# Patient Record
Sex: Female | Born: 1995
Health system: Southern US, Community
[De-identification: ages and names within clinical notes are randomized; demographics above are authoritative.]

## PROBLEM LIST (undated history)

## (undated) ENCOUNTER — Emergency Department (HOSPITAL_COMMUNITY): Admission: EM | Payer: Medicaid Other | Source: Home / Self Care

## (undated) DIAGNOSIS — B019 Varicella without complication: Secondary | ICD-10-CM

## (undated) DIAGNOSIS — R519 Headache, unspecified: Secondary | ICD-10-CM

## (undated) DIAGNOSIS — R51 Headache: Secondary | ICD-10-CM

## (undated) DIAGNOSIS — J45909 Unspecified asthma, uncomplicated: Secondary | ICD-10-CM

## (undated) HISTORY — DX: Headache, unspecified: R51.9

## (undated) HISTORY — DX: Headache: R51

## (undated) HISTORY — PX: WISDOM TOOTH EXTRACTION: SHX21

## (undated) HISTORY — DX: Varicella without complication: B01.9

---
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
## 2013-01-01 ENCOUNTER — Encounter: Payer: Self-pay | Admitting: Psychiatry

## 2013-07-08 ENCOUNTER — Emergency Department (HOSPITAL_COMMUNITY)
Admission: EM | Admit: 2013-07-08 | Discharge: 2013-07-08 | Disposition: A | Discharge status: Home / Self Care | Payer: Medicaid Other | Attending: Emergency Medicine | Admitting: Emergency Medicine

## 2013-07-08 ENCOUNTER — Encounter (HOSPITAL_COMMUNITY): Payer: Self-pay | Admitting: Emergency Medicine

## 2013-07-08 DIAGNOSIS — J45909 Unspecified asthma, uncomplicated: Secondary | ICD-10-CM | POA: Insufficient documentation

## 2013-07-08 DIAGNOSIS — Z3202 Encounter for pregnancy test, result negative: Secondary | ICD-10-CM | POA: Insufficient documentation

## 2013-07-08 DIAGNOSIS — R112 Nausea with vomiting, unspecified: Secondary | ICD-10-CM | POA: Insufficient documentation

## 2013-07-08 DIAGNOSIS — Z880 Allergy status to penicillin: Secondary | ICD-10-CM | POA: Insufficient documentation

## 2013-07-08 DIAGNOSIS — Z79899 Other long term (current) drug therapy: Secondary | ICD-10-CM | POA: Insufficient documentation

## 2013-07-08 HISTORY — DX: Unspecified asthma, uncomplicated: J45.909

## 2013-07-08 LAB — URINALYSIS, ROUTINE W REFLEX MICROSCOPIC
Bilirubin Urine: NEGATIVE
Hgb urine dipstick: NEGATIVE
Ketones, ur: NEGATIVE mg/dL
Specific Gravity, Urine: 1.013 (ref 1.005–1.030)

## 2013-07-08 LAB — BASIC METABOLIC PANEL
Calcium: 10 mg/dL (ref 8.4–10.5)
Chloride: 105 mEq/L (ref 96–112)
Glucose, Bld: 103 mg/dL — ABNORMAL HIGH (ref 70–99)
Potassium: 3.8 mEq/L (ref 3.5–5.1)

## 2013-07-08 LAB — CBC WITH DIFFERENTIAL/PLATELET
Basophils Relative: 1 % (ref 0–1)
HCT: 37.5 % (ref 36.0–46.0)
Lymphocytes Relative: 20 % (ref 12–46)
MCHC: 34.7 g/dL (ref 30.0–36.0)
MCV: 81.5 fL (ref 78.0–100.0)
Monocytes Relative: 4 % (ref 3–12)
Neutro Abs: 7.4 10*3/uL (ref 1.7–7.7)
Neutrophils Relative %: 76 % (ref 43–77)
RDW: 13.2 % (ref 11.5–15.5)

## 2013-07-08 LAB — URINE MICROSCOPIC-ADD ON

## 2013-07-08 LAB — HEPATIC FUNCTION PANEL: Total Bilirubin: 0.2 mg/dL — ABNORMAL LOW (ref 0.3–1.2)

## 2013-07-08 MED ORDER — PROMETHAZINE HCL 25 MG PO TABS: 25.0000 mg | ORAL_TABLET | Freq: Four times a day (QID) | ORAL | Status: DC | PRN

## 2013-07-08 MED ORDER — SODIUM CHLORIDE 0.9 % IV BOLUS (SEPSIS)
1000.0000 mL | Freq: Once | INTRAVENOUS | Status: AC
  Administered 2013-07-08: 1000 mL via INTRAVENOUS

## 2013-07-08 MED ORDER — PROCHLORPERAZINE EDISYLATE 5 MG/ML IJ SOLN
10.0000 mg | Freq: Once | INTRAMUSCULAR | Status: DC
  Filled 2013-07-08: qty 2

## 2013-07-08 NOTE — ED Notes (Signed)
Visual Acuity check: No corrective eye wear Left eye 20/10 Right eye 20/13 Together 20/10

## 2013-07-08 NOTE — ED Notes (Signed)
Patient ambulated to restroom and tolerated well.  

## 2013-07-08 NOTE — ED Notes (Signed)
Patient states nausea x 3 days.  Vomited one time this morning.   Patient states she has just been feeling poorly the last few days.   Patient states she went to her doctors office yesterday.   Patient states not pregnant - they did a test at her doctors office.  Patient states that the difference from yesterday to today was blurred vision when she vomited.

## 2013-07-08 NOTE — ED Provider Notes (Signed)
Medical screening examination/treatment/procedure(s) were performed by non-physician practitioner and as supervising physician I was immediately available for consultation/collaboration.  Jolana Runkles, MD 07/08/13 1414 

## 2013-07-08 NOTE — ED Provider Notes (Signed)
CSN: [id number]     Arrival date & time 07/08/13  1029 History     First MD Initiated Contact with Patient 07/08/13 1044     Chief Complaint  Patient presents with  . Nausea  . Emesis   (Consider location/radiation/quality/duration/timing/severity/associated sxs/prior Treatment) HPI  Cynthia Klein is a 17 y.o. female with past medical history significant for asthma complaining of nausea onset 3 days ago, states nausea is worse in the a.m. She had single episode of nonbloody, nonbilious emesis this a.m. Patient also reports feeling shaky, weak endorses a diffuse, 5/10 headache, took Excedrin Migraine last night with good relief.. Patient states she has blurred vision today. She denies fever, cervicalgia, cough, rhinorrhea, abdominal pain.  Last menstrual period 2 weeks  ago  Past Medical History  Diagnosis Date  . Asthma    History reviewed. No pertinent past surgical history. No family history on file. History  Substance Use Topics  . Smoking status: Never Smoker   . Smokeless tobacco: Not on file  . Alcohol Use: No   OB History   Grav Para Term Preterm Abortions TAB SAB Ect Mult Living                 Review of Systems 10 systems reviewed and found to be negative, except as noted in the HPI  Allergies  Penicillins  Home Medications   Current Outpatient Rx  Name  Route  Sig  Dispense  Refill  . Aspirin-Acetaminophen-Caffeine (EXCEDRIN MIGRAINE PO)   Oral   Take 2 tablets by mouth daily as needed (headache).         . norethindrone-ethinyl estradiol (JUNEL FE,GILDESS FE,LOESTRIN FE) 1-20 MG-MCG tablet   Oral   Take 1 tablet by mouth daily.          BP 113/64  Pulse 58  Temp(Src) 97.7 F (36.5 C) (Oral)  Resp 18  Ht 4\' 10"  (1.473 m)  Wt 92 lb (41.731 kg)  BMI 19.23 kg/m2  SpO2 100%  LMP 06/24/2013 Physical Exam  Nursing note and vitals reviewed. Constitutional: She is oriented to person, place, and time. She appears well-developed and well-nourished.  No distress.  HENT:  Head: Normocephalic and atraumatic.  Mouth/Throat: Oropharynx is clear and moist.  Left eye 20/10 Right eye 20/13 Together 20/10  Eyes: Conjunctivae and EOM are normal. Pupils are equal, round, and reactive to light.  Neck: Normal range of motion.  Cardiovascular: Normal rate, regular rhythm and intact distal pulses.   Pulmonary/Chest: Effort normal and breath sounds normal. No stridor.  Abdominal: Soft. Bowel sounds are normal.  Musculoskeletal: Normal range of motion.  Neurological: She is alert and oriented to person, place, and time.  Follows commands, Goal oriented speech, Strength is 5 out of 5x4 extremities, patient ambulates with a coordinated in nonantalgic gait. Sensation is grossly intact.  No Cut on visual fields   Psychiatric: She has a normal mood and affect.    ED Course   Procedures (including critical care time)  Labs Reviewed  URINALYSIS, ROUTINE W REFLEX MICROSCOPIC  PREGNANCY, URINE  CBC WITH DIFFERENTIAL  BASIC METABOLIC PANEL  HEPATIC FUNCTION PANEL  LIPASE, BLOOD   No results found. 1. Nausea & vomiting     MDM   Filed Vitals:   07/08/13 1200 07/08/13 1202 07/08/13 1203 07/08/13 1220  BP: 103/61 109/63 104/64 99/51  Pulse: 95 94 97 87  Temp:    98 F (36.7 C)  TempSrc:    Oral  Resp:  16  Height:      Weight:      SpO2:    100%     Cynthia Klein is a 17 y.o. female with 3 days of morning nausea, one episode of vomiting mild headache associated with blurred vision. Pt HA treated and improved while in ED.  Presentation is like pts typical HA and non concerning for [facility], ICH, Meningitis, or temporal arteritis. Pt is afebrile with no focal neuro deficits, nuchal rigidity, or change in vision. Pt is to follow up with PCP to discuss prophylactic medication. Pt verbalizes understanding and is agreeable with plan to dc.   Medications  prochlorperazine (COMPAZINE) injection 10 mg (10 mg Intravenous Not Given 07/08/13 1204)   sodium chloride 0.9 % bolus 1,000 mL (1,000 mLs Intravenous New Bag/Given 07/08/13 1159)    Pt is hemodynamically stable, appropriate for, and amenable to discharge at this time. Pt verbalized understanding and agrees with care plan. All questions answered. Outpatient follow-up and specific return precautions discussed.    New Prescriptions   PROMETHAZINE (PHENERGAN) 25 MG TABLET    Take 1 tablet (25 mg total) by mouth every 6 (six) hours as needed for nausea.    Note: Portions of this report may have been transcribed using voice recognition software. Every effort was made to ensure accuracy; however, inadvertent computerized transcription errors may be present    Wynetta Emery, PA-C 07/08/13 1358

## 2013-07-10 ENCOUNTER — Emergency Department (HOSPITAL_COMMUNITY)
Admission: EM | Admit: 2013-07-10 | Discharge: 2013-07-10 | Disposition: A | Discharge status: Home / Self Care | Payer: Medicaid Other | Attending: Emergency Medicine | Admitting: Emergency Medicine

## 2013-07-10 ENCOUNTER — Encounter (HOSPITAL_COMMUNITY): Payer: Self-pay

## 2013-07-10 ENCOUNTER — Emergency Department (HOSPITAL_COMMUNITY): Payer: Medicaid Other

## 2013-07-10 DIAGNOSIS — R110 Nausea: Secondary | ICD-10-CM | POA: Insufficient documentation

## 2013-07-10 DIAGNOSIS — J45909 Unspecified asthma, uncomplicated: Secondary | ICD-10-CM | POA: Insufficient documentation

## 2013-07-10 DIAGNOSIS — R42 Dizziness and giddiness: Secondary | ICD-10-CM | POA: Insufficient documentation

## 2013-07-10 DIAGNOSIS — Z79899 Other long term (current) drug therapy: Secondary | ICD-10-CM | POA: Insufficient documentation

## 2013-07-10 DIAGNOSIS — Z3202 Encounter for pregnancy test, result negative: Secondary | ICD-10-CM | POA: Insufficient documentation

## 2013-07-10 DIAGNOSIS — Z880 Allergy status to penicillin: Secondary | ICD-10-CM | POA: Insufficient documentation

## 2013-07-10 DIAGNOSIS — G43909 Migraine, unspecified, not intractable, without status migrainosus: Secondary | ICD-10-CM | POA: Insufficient documentation

## 2013-07-10 LAB — BASIC METABOLIC PANEL
CO2: 24 mEq/L (ref 19–32)
Glucose, Bld: 118 mg/dL — ABNORMAL HIGH (ref 70–99)
Potassium: 3.6 mEq/L (ref 3.5–5.1)
Sodium: 143 mEq/L (ref 135–145)

## 2013-07-10 LAB — CBC WITH DIFFERENTIAL/PLATELET
Lymphocytes Relative: 16 % (ref 12–46)
Lymphs Abs: 0.9 10*3/uL (ref 0.7–4.0)
Neutrophils Relative %: 82 % — ABNORMAL HIGH (ref 43–77)
Platelets: 235 10*3/uL (ref 150–400)
RBC: 4.43 MIL/uL (ref 3.87–5.11)
WBC: 5.8 10*3/uL (ref 4.0–10.5)

## 2013-07-10 LAB — URINALYSIS, ROUTINE W REFLEX MICROSCOPIC
Hgb urine dipstick: NEGATIVE
Nitrite: NEGATIVE
Specific Gravity, Urine: 1.005 (ref 1.005–1.030)
Urobilinogen, UA: 0.2 mg/dL (ref 0.0–1.0)

## 2013-07-10 MED ORDER — SODIUM CHLORIDE 0.9 % IV BOLUS (SEPSIS)
1000.0000 mL | Freq: Once | INTRAVENOUS | Status: AC
  Administered 2013-07-10: 1000 mL via INTRAVENOUS

## 2013-07-10 MED ORDER — DEXAMETHASONE SODIUM PHOSPHATE 10 MG/ML IJ SOLN
10.0000 mg | Freq: Once | INTRAMUSCULAR | Status: AC
  Administered 2013-07-10: 10 mg via INTRAVENOUS
  Filled 2013-07-10: qty 1

## 2013-07-10 MED ORDER — MECLIZINE HCL 25 MG PO TABS: 25.0000 mg | ORAL_TABLET | Freq: Four times a day (QID) | ORAL | Status: DC

## 2013-07-10 MED ORDER — LORAZEPAM 2 MG/ML IJ SOLN
0.5000 mg | Freq: Once | INTRAMUSCULAR | Status: AC
  Administered 2013-07-10: 0.5 mg via INTRAVENOUS
  Filled 2013-07-10: qty 1

## 2013-07-10 MED ORDER — SODIUM CHLORIDE 0.9 % IV SOLN
INTRAVENOUS | Status: DC
  Administered 2013-07-10: 20:00:00 via INTRAVENOUS

## 2013-07-10 MED ORDER — PROMETHAZINE HCL 25 MG/ML IJ SOLN
25.0000 mg | Freq: Once | INTRAMUSCULAR | Status: AC
  Administered 2013-07-10: 25 mg via INTRAVENOUS
  Filled 2013-07-10: qty 1

## 2013-07-10 MED ORDER — MECLIZINE HCL 25 MG PO TABS
25.0000 mg | ORAL_TABLET | Freq: Once | ORAL | Status: AC
  Administered 2013-07-10: 25 mg via ORAL
  Filled 2013-07-10: qty 1

## 2013-07-10 MED ORDER — DIPHENHYDRAMINE HCL 50 MG/ML IJ SOLN
25.0000 mg | Freq: Once | INTRAMUSCULAR | Status: AC
  Administered 2013-07-10: 25 mg via INTRAVENOUS
  Filled 2013-07-10: qty 1

## 2013-07-10 NOTE — ED Notes (Signed)
Pt c/o headache and nausea x5 days, blurred vision, vomiting, posterior neck pain, and tremors x 3 days

## 2013-07-10 NOTE — ED Notes (Signed)
Urine was on hold in the main lab.  UA form was sent down.

## 2013-07-10 NOTE — ED Provider Notes (Signed)
Medical screening examination/treatment/procedure(s) were performed by non-physician practitioner and as supervising physician I was immediately available for consultation/collaboration.   David H Yao, MD 07/10/13 2348 

## 2013-07-10 NOTE — ED Provider Notes (Signed)
CSN: [id number]     Arrival date & time 07/10/13  1527 History     First MD Initiated Contact with Patient 07/10/13 1607     Chief Complaint  Patient presents with  . Headache  . Nausea   (Consider location/radiation/quality/duration/timing/severity/associated sxs/prior Treatment) HPI  PCP: Nathanial Millman, MD 17 yo WF returns [facility] for persistent migraine and nausea x 5 days. Came into ED two days ago for same migraine and was given IV fluids and Phenergan. States migraine was never relieved and now c/o dizziness and sore neck since last night. Throbbing migraine localized to the left temple and grades pain 5/10. Has tried Excedrin Migraine to help her fall asleep. Has not filled Phenergan Rx given to her at prior ED visit.   LMP- two weeks ago. Pt has hx of migraines but feels as though they are becoming more frequent. + Family hx.  Denies any fevers, chills, emesis, cough, phonophobia, decrease in [place], numbness/tingling, weakness, diarrhea, constipation, or dysuria.     Past Medical History  Diagnosis Date  . Asthma    History reviewed. No pertinent past surgical history. History reviewed. No pertinent family history. History  Substance Use Topics  . Smoking status: Never Smoker   . Smokeless tobacco: Not on file  . Alcohol Use: No   OB History   Grav Para Term Preterm Abortions TAB SAB Ect Mult Living                 Review of Systems ROS is negative unless otherwise stated in the HPI  Allergies  Penicillins  Home Medications   Current Outpatient Rx  Name  Route  Sig  Dispense  Refill  . Aspirin-Acetaminophen-Caffeine (EXCEDRIN MIGRAINE PO)   Oral   Take 2 tablets by mouth daily as needed (headache).         . norethindrone-ethinyl estradiol (JUNEL FE,GILDESS FE,LOESTRIN FE) 1-20 MG-MCG tablet   Oral   Take 1 tablet by mouth daily.         . promethazine (PHENERGAN) 25 MG tablet   Oral   Take 1 tablet (25 mg total) by mouth every 6 (six) hours as needed  for nausea.   12 tablet   0   . meclizine (ANTIVERT) 25 MG tablet   Oral   Take 1 tablet (25 mg total) by mouth 4 (four) times daily.   28 tablet   0    BP 116/70  Pulse 94  Temp(Src) 98.2 F (36.8 C) (Oral)  Resp 22  SpO2 100%  LMP 06/24/2013 Physical Exam  Nursing note and vitals reviewed. Constitutional: She is oriented to person, place, and time. She appears well-developed and well-nourished. No distress.  HENT:  Head: Normocephalic and atraumatic.  Eyes: Pupils are equal, round, and reactive to light.  Neck: Normal range of motion. Neck supple.  Cardiovascular: Normal rate and regular rhythm.   Pulmonary/Chest: Effort normal.  Abdominal: Soft.  Neurological: She is alert and oriented to person, place, and time. She has normal strength. No cranial nerve deficit or sensory deficit. Coordination and gait normal. GCS eye subscore is 4. GCS verbal subscore is 5. GCS motor subscore is 6.  Skin: Skin is warm and dry.    ED Course   Procedures (including critical care time)  Labs Reviewed  CBC WITH DIFFERENTIAL - Abnormal; Notable for the following:    Neutrophils Relative % 82 (*)    Monocytes Relative 2 (*)    All other components within normal limits  BASIC METABOLIC PANEL - Abnormal; Notable for the following:    Glucose, Bld 118 (*)    All other components within normal limits  PREGNANCY, URINE  URINALYSIS, ROUTINE W REFLEX MICROSCOPIC   Ct Head Wo Contrast  07/10/2013   *RADIOLOGY REPORT*  Clinical Data: Headache and dizziness, nausea and vomiting  CT HEAD WITHOUT CONTRAST  Technique:  Contiguous axial images were obtained from the base of the skull through the vertex without contrast.  Comparison: 05/21/2010  Findings: Streak artifact from the patient's earrings is noted at the skull base. No acute hemorrhage, acute infarction, or mass lesion is identified.  No midline shift.  No ventriculomegaly.  No skull fracture.  No midline shift.  Orbits and paranasal sinuses  are intact.  IMPRESSION: No acute intracranial finding.   Original Report Authenticated By: Christiana Pellant, M.D.   1. Vertigo   2. Migraine     MDM  IV Saline 1000 ml, 10mg  IV Decadron 10mg , 25mg  IV Benadryl  Nursing student attempting to start IV and patient got really anxious about it.  Date: 07/10/2013  Rate: 140  Rhythm: sinus tachycardia  QRS Axis: normal  Intervals: normal  ST/T Wave abnormalities: normal  Conduction Disutrbances:none  Narrative Interpretation:   Old EKG Reviewed: unchanged    Patient is feeling much better. Headache has completely resolveed. Work-up is benign and pt and mother are comfortable taking her home. Pt will follow-up with PCP and i will give a neurology referral.  Rx: Antivert.  17 y.o.Terrence Morrone's evaluation in the Emergency Department is complete. It has been determined that no acute conditions requiring further emergency intervention are present at this time. The patient/guardian have been advised of the diagnosis and plan. We have discussed signs and symptoms that warrant return to the ED, such as changes or worsening in symptoms.  Vital signs are stable at discharge. Filed Vitals:   07/10/13 1915  BP: 116/70  Pulse: 94  Temp:   Resp: 22    Patient/guardian has voiced understanding and agreed to follow-up with the PCP or specialist.      Dorthula Matas, PA-C 07/10/13 2038

## 2013-07-10 NOTE — ED Notes (Signed)
Pt stated she had just went to the restroom and when she was able to void again she would advise staff.

## 2013-08-03 ENCOUNTER — Encounter: Payer: Self-pay | Admitting: Neurology

## 2013-08-03 ENCOUNTER — Ambulatory Visit (INDEPENDENT_AMBULATORY_CARE_PROVIDER_SITE_OTHER): Payer: Medicaid Other | Admitting: Neurology

## 2013-08-03 VITALS — HR 73 | Ht 60.0 in | Wt 95.0 lb

## 2013-08-03 DIAGNOSIS — R51 Headache: Secondary | ICD-10-CM

## 2013-08-03 DIAGNOSIS — G43109 Migraine with aura, not intractable, without status migrainosus: Secondary | ICD-10-CM | POA: Insufficient documentation

## 2013-08-03 MED ORDER — NORTRIPTYLINE HCL 10 MG PO CAPS: 10.0000 mg | ORAL_CAPSULE | Freq: Every day | ORAL | Status: DC

## 2013-08-03 NOTE — Progress Notes (Signed)
[facility]  Provider:  Dr Hosie Klein Referring Provider: Berneta Levins, DO Primary Care Physician:  Cynthia Klein A  CC:  Headaches  HPI:  Cynthia Klein is a 17 y.o. female here as a referral from Cynthia. Earlene Klein for evaluation of headache  Her first headache began around 2-3 years ago. She notes having daily chronic headache since then. Will also have one to 2 severe exacerbations per week. Baseline headache described as generalized tension squeezing type headache. Exacerbations described as typical unilateral pounding, positive nausea and vomiting, and positive photo and phonophobia. These exacerbations can last hours to days. Typically 1 to dark quiet room. These headaches come on slowly and build gradually. Describes an aura as a visual field, and blurring of vision. With headaches have some dizziness and lightheadedness, minimal vertigo. No focal sensory or motor changes. Denies any family history of headaches. No history of head trauma. Denies any triggering factors. No living factors. Reports getting good night sleep, typically gets 7 hours of sleep a night. Denies excessive caffeine intake.  Has been on oral contraceptive pill around 2-3 years. Has tried Excedrin Migraine and ibuprofen for the headache. Typically takes Excedrin just a few times a month. Try meclizine with no benefit. Prescribed Phenergan but has not tried.  Currently works at [employer] reports having missed some days of work  due to her headaches.   Review of Systems: Out of a complete 14 system review, the patient complains of only the following symptoms, and all other reviewed systems are negative. Positive for blurred vision loss of vision headache dizziness   History   Social History  . Marital Status: Married    Spouse Name: N/A    Number of Children: N/A  . Years of Education: N/A   Occupational History  . Not on file.   Social History Main Topics  . Smoking status: Never Smoker   .  Smokeless tobacco: Never Used  . Alcohol Use: No  . Drug Use: No  . Sexual Activity: Not on file   Other Topics Concern  . Not on file   Social History Narrative   Patient lives at home with her parents.    Patient works at [employer].   Patient is single.    Patient has a high school education.    Patient has no children.     No family history on file.  Past Medical History  Diagnosis Date  . Asthma     Past Surgical History  Procedure Laterality Date  . Wisdom tooth extraction      Current Outpatient Prescriptions  Medication Sig Dispense Refill  . Aspirin-Acetaminophen-Caffeine (EXCEDRIN MIGRAINE PO) Take 2 tablets by mouth daily as needed (headache).      Marland Kitchen ibuprofen (ADVIL,MOTRIN) 800 MG tablet Take 800 mg by mouth daily.      . norethindrone-ethinyl estradiol (JUNEL FE,GILDESS FE,LOESTRIN FE) 1-20 MG-MCG tablet Take 1 tablet by mouth daily.       No current facility-administered medications for this visit.    Allergies as of 08/03/2013 - Review Complete 08/03/2013  Allergen Reaction Noted  . Penicillins Hives 07/08/2013    Vitals: Pulse 73  Ht 5' (1.524 m)  Wt 95 lb (43.092 kg)  BMI 18.55 kg/m2  LMP 06/24/2013 Last Weight:  Wt Readings from Last 1 Encounters:  08/03/13 95 lb (43.092 kg) (1%*, Z = -2.24)   * Growth percentiles are based on CDC 2-20 Years data.   Last Height:   Ht Readings from Last 1  Encounters:  08/03/13 5' (1.524 m) (5%*, Z = -1.67)   * Growth percentiles are based on CDC 2-20 Years data.     Physical exam: Exam: Gen: NAD, conversant Eyes: anicteric sclerae, moist conjunctivae HENT: Atraumati Neck: Trachea midline; supple,  Lungs: CTA, no wheezing, rales, rhonic                          CV: RRR, no MRG Abdomen: Soft, non-tender;  Extremities: No peripheral edema  Skin: Normal temperature, no rash,  Psych: Appropriate affect, pleasant  Neuro: Cynthia: AA&Ox3, appropriately interactive, normal affect   Speech: fluent w/o  paraphasic error  Memory: good recent and remote recall  CN: PERRL, EOMI no nystagmus, no ptosis, fundoscopic exam wnl, VFF to FC bilat, sensation intact to LT V1-V3 bilat, face symmetric, no weakness, hearing grossly intact, palate elevates symmetrically, shoulder shrug 5/5 bilat,  tongue protrudes midline, no fasiculations noted.  Motor: normal bulk and tone Strength: 5/5  In all extremities  Coord: rapid alternating and point-to-point (FNF, HTS) movements intact.  Reflexes: symmetrical, bilat downgoing toes  Sens: LT intact in all extremities  Gait: posture, stance, stride and arm-swing normal. Tandem gait intact. Able to walk on heels and toes. Romberg absent.   Assessment:  After physical and neurologic examination, review of laboratory studies, imaging, neurophysiology testing and pre-existing records, assessment will be reviewed on the problem list.  Plan:  Treatment plan and additional workup will be reviewed under Problem List.  Cynthia Klein is a pleasant 32-year-old woman who presents for initial evaluation of 2 to three-year history of headache. Headaches are consistent with a chronic daily headache and multiple exacerbations of a migrainous type headache per month. This is her first evaluation with neurology and has not been on any prophylactic medication in the past. Physical exam is unremarkable. After discussion of different therapeutic options decided to try Pamelor 10 mg nightly. Patient was also counseled on potentially switching to a different oral contraceptive pill. Will continue to use Excedrin and ibuprofen as needed for breakthrough headaches.  1)Migraine with aura 2)chronic daily headache  -start Pamelor 10mg  qhs, can titrate up as needed -continue ibuprofen and excedrin as needed for breakthrough headaches -patient counseled to discuss switching to another OCP with her pediatrician. May benefit from progestin only pill -follow up in 4 months or as needed

## 2013-08-03 NOTE — Patient Instructions (Signed)
Overall you are doing fairly well but I do want to suggest a few things today:   Remember to drink plenty of fluid, eat healthy meals and do not skip any meals. Try to eat protein with a every meal and eat a healthy snack such as fruit or nuts in between meals. Try to keep a regular sleep-wake schedule and try to exercise daily, particularly in the form of walking, 20-30 minutes a day, if you can.   As far as your medications are concerned, I would like to suggest starting a medication called Pamelor. This will help decrease the frequency of your headaches. Start by taking one tablet nightly. The main side effects are fatigue and dry mouth. Continue to use the Excedrin and ibuprofen as needed for breakthrough headaches.   Consider discussing switching to another oral contraceptive pill with your pediatrician. Sometimes, OCPs that contain only progestin do not cause as many headaches.   I would like to see you back in 4 months, sooner if we need to. Please call us with any interim questions, concerns, problems, updates or refill requests.   Please also call us for any test results so we can go over those with you on the phone.  My clinical assistant and will answer any of your questions and relay your messages to me and also relay most of my messages to you.   Our phone number is [phone]. We also have an after hours call service for urgent matters and there is a physician on-call for urgent questions. For any emergencies you know to call 911 or go to the nearest emergency room

## 2013-09-13 IMAGING — CR DG SHOULDER 2+V*L*
3 series · 3 of 3 positions shown · non-contrast
Comparison: None.

CLINICAL DATA: Shoulder pain.

LEFT SHOULDER - 2+ VIEW

[w shoulder ap internal left]
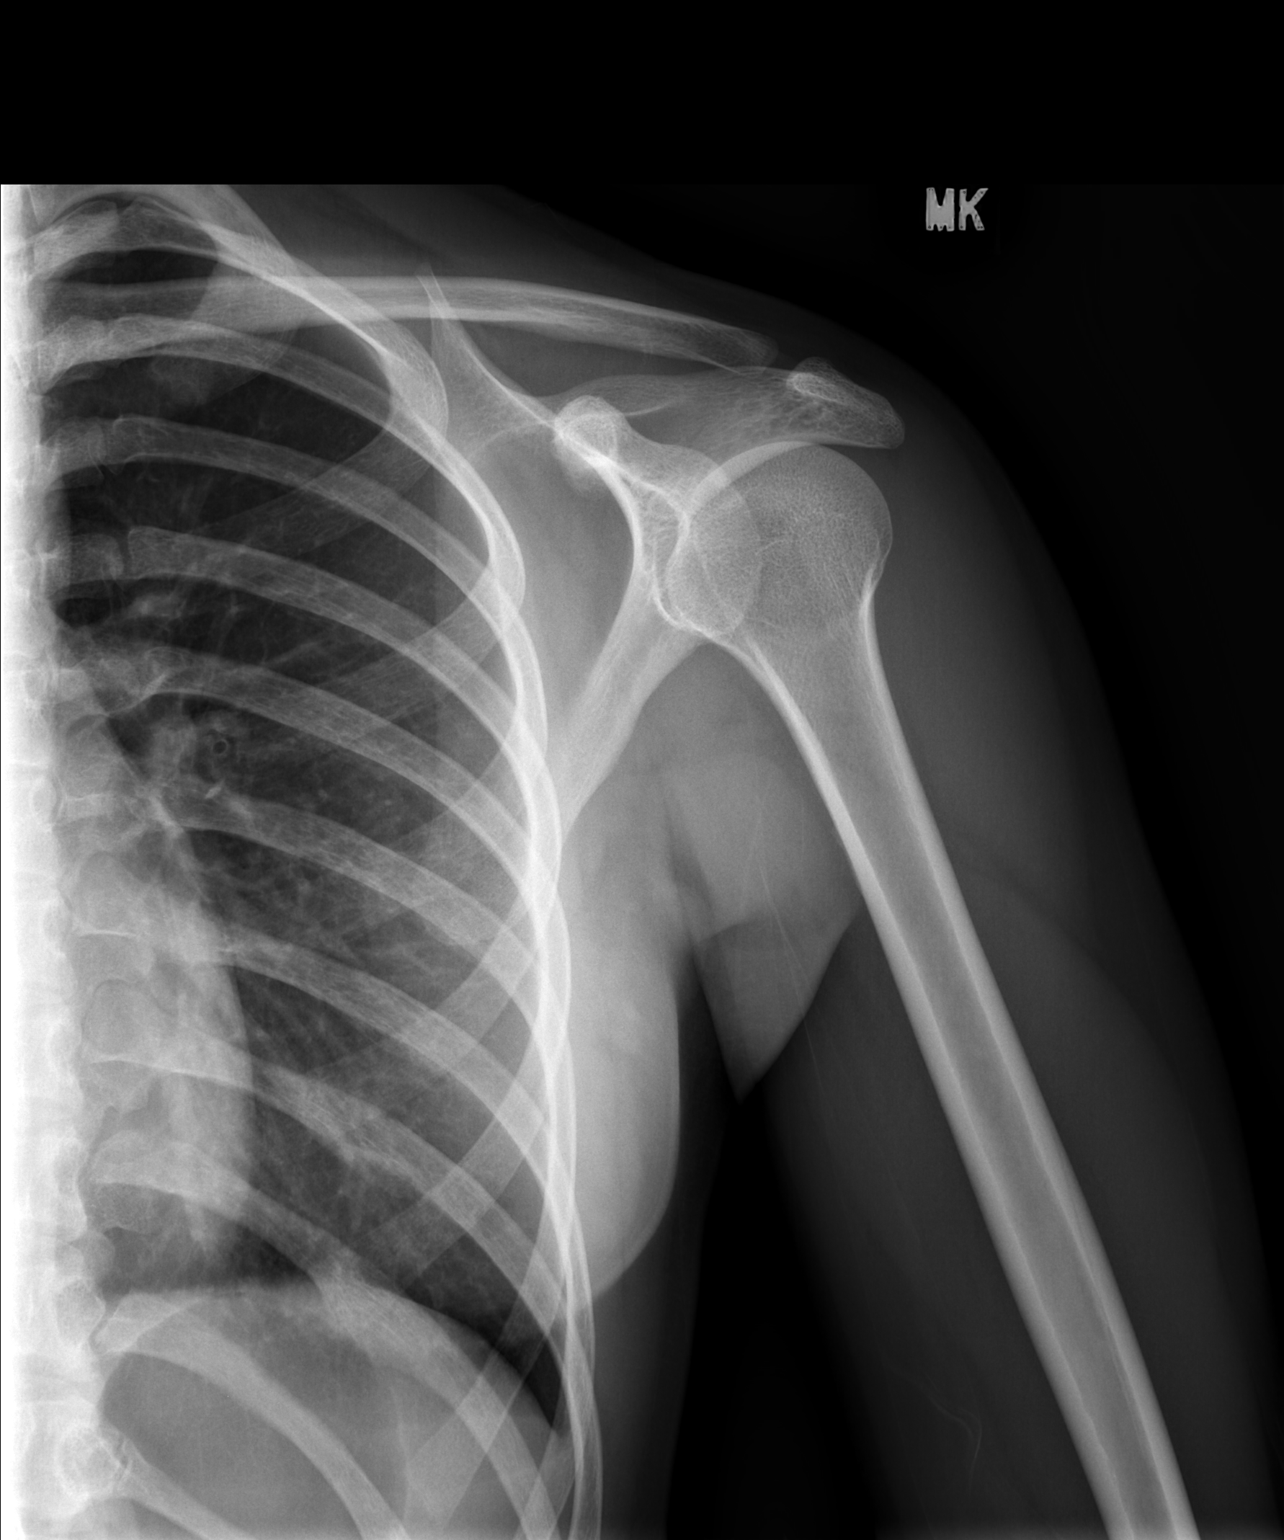

[w shoulder ap external left]
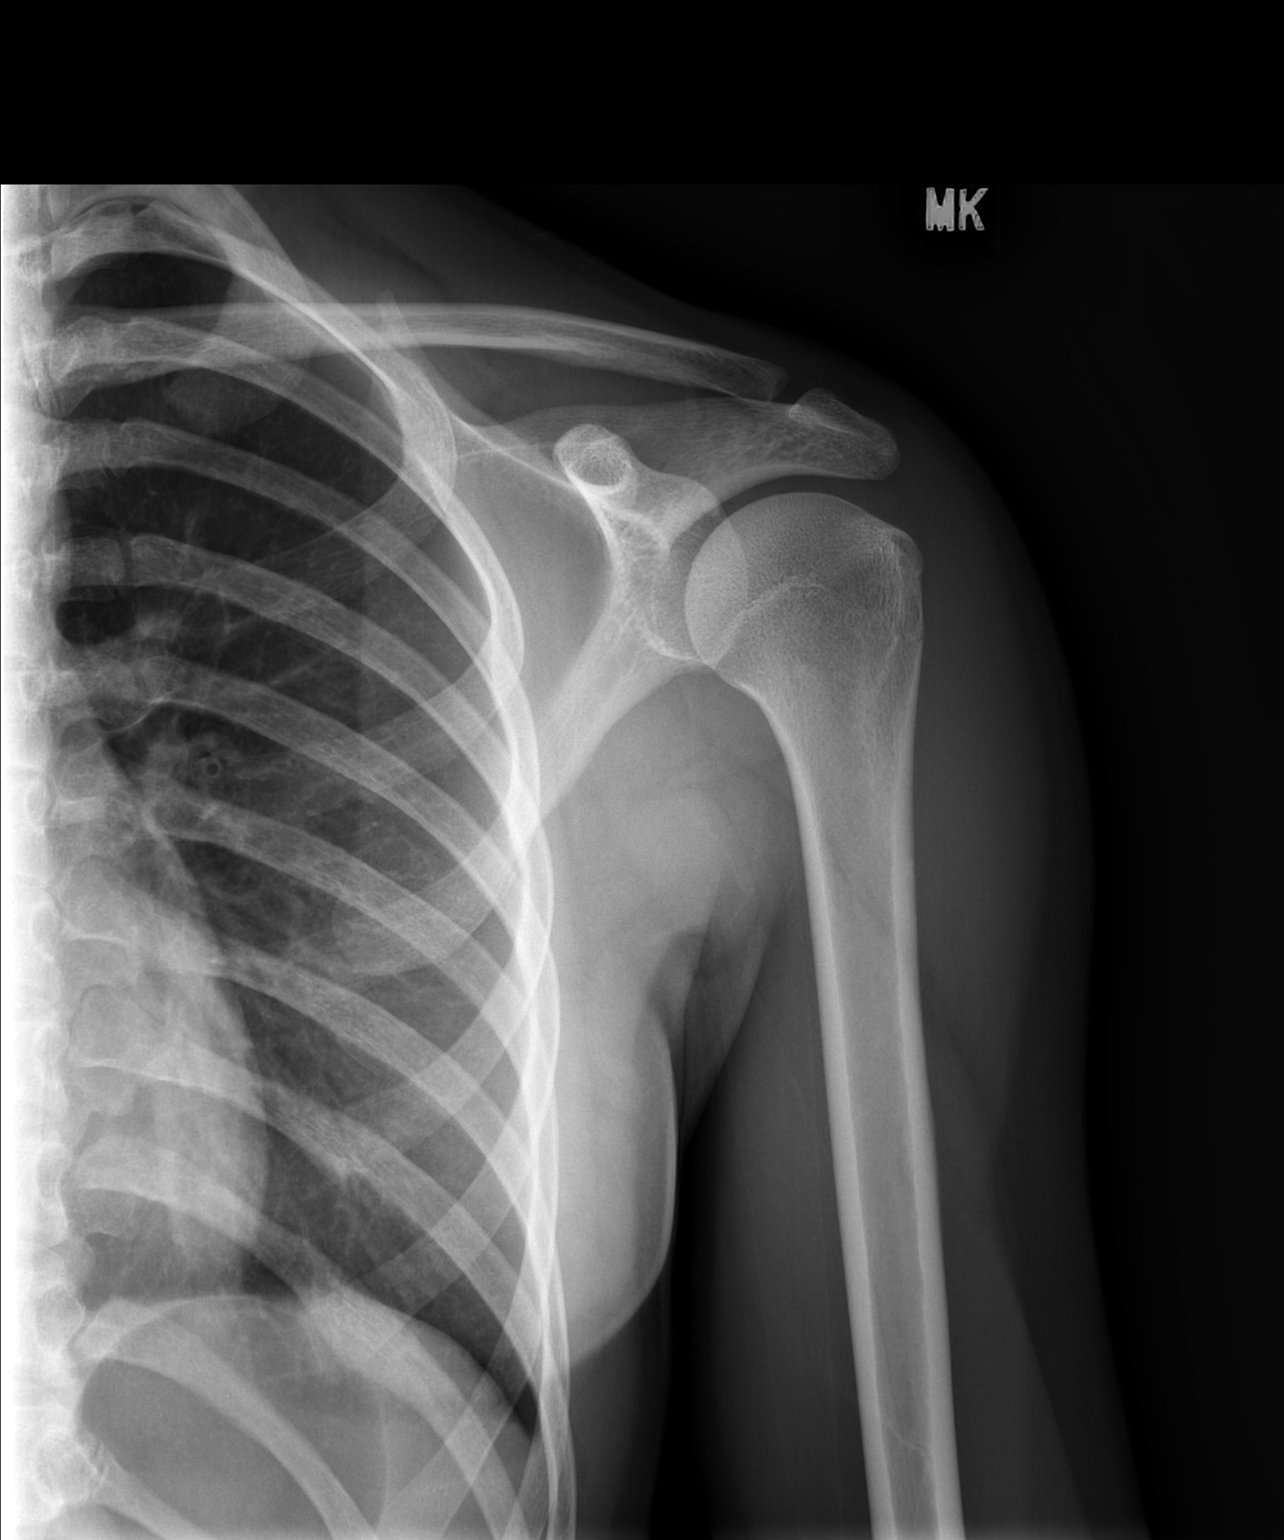

[w shoulder y view left]
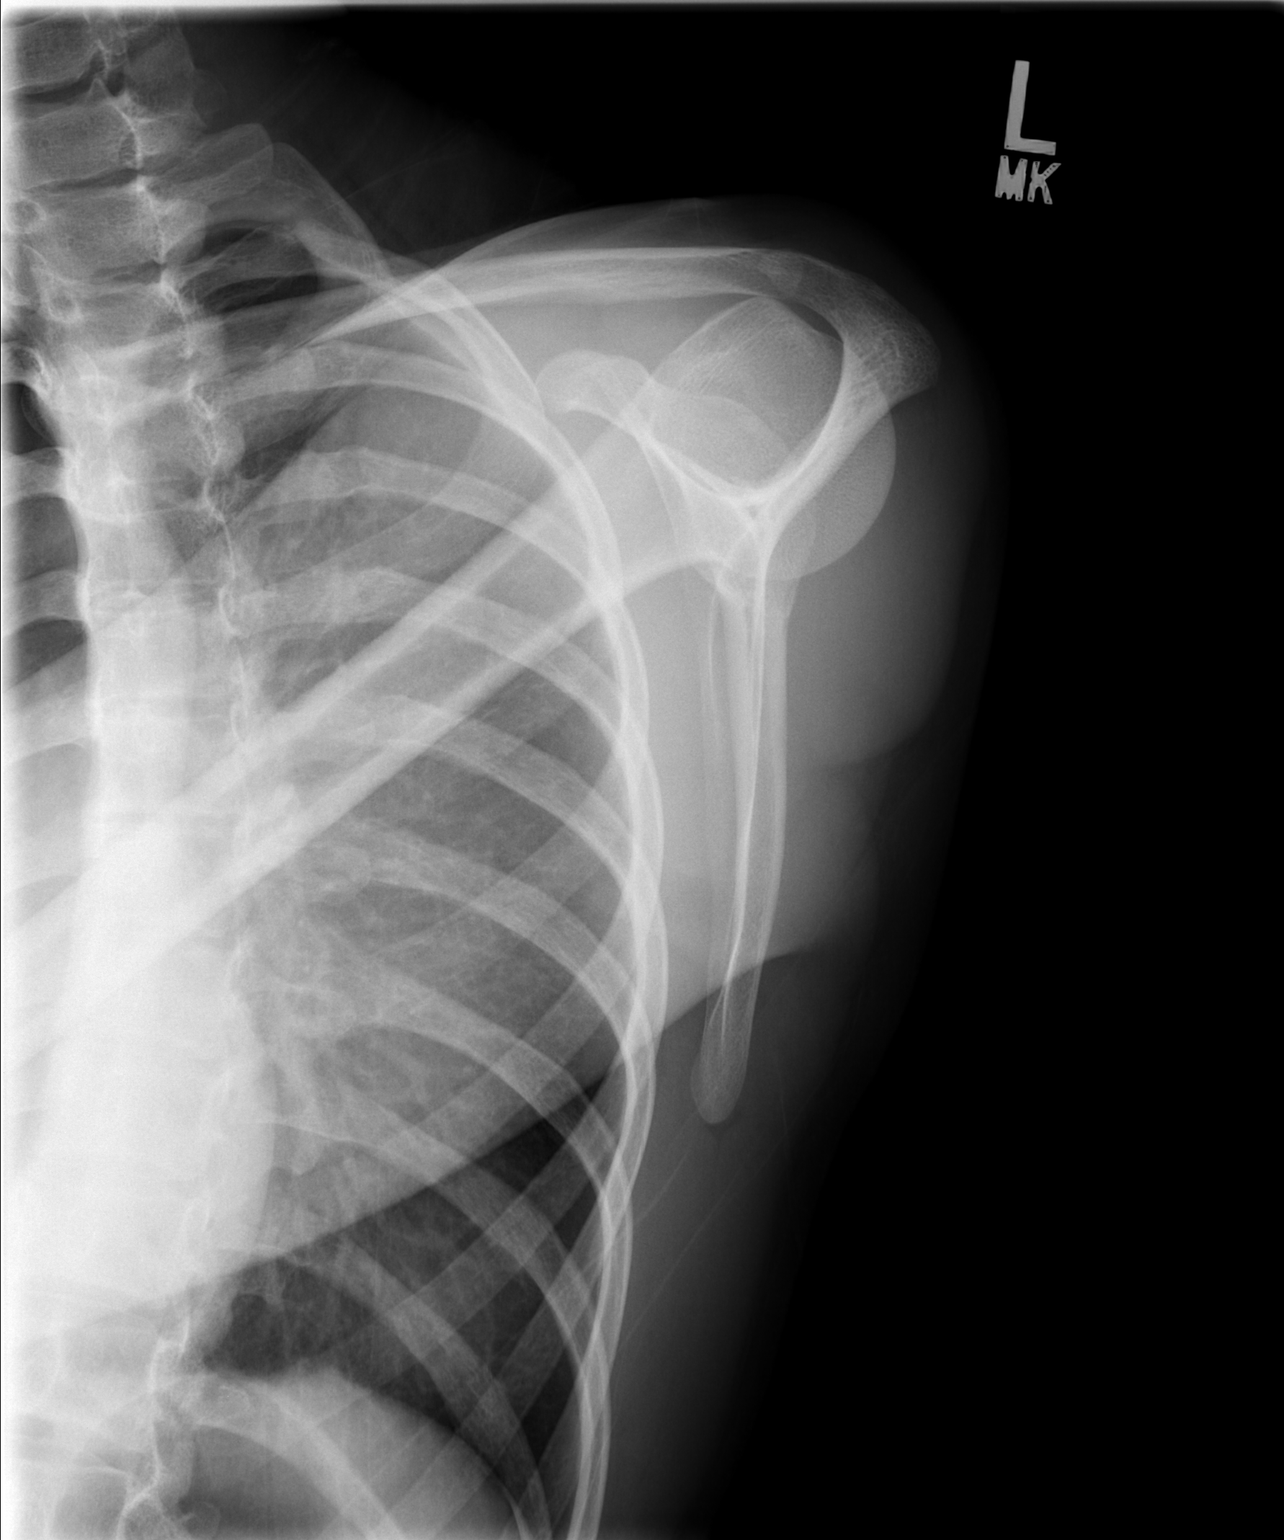

[3 of 3 positions shown; findings below may reference images not displayed]

FINDINGS: No joint effusion or fracture.  No evidence of a healed
fracture.  No degenerative changes.  Visualized portion of the left
chest is unremarkable.
IMPRESSION: Negative.

## 2013-12-06 ENCOUNTER — Ambulatory Visit: Payer: Medicaid Other | Admitting: Neurology

## 2015-02-08 ENCOUNTER — Encounter: Payer: Self-pay | Admitting: Internal Medicine

## 2015-02-08 ENCOUNTER — Encounter (INDEPENDENT_AMBULATORY_CARE_PROVIDER_SITE_OTHER): Payer: Self-pay

## 2015-02-08 ENCOUNTER — Ambulatory Visit (INDEPENDENT_AMBULATORY_CARE_PROVIDER_SITE_OTHER): Payer: BLUE CROSS/BLUE SHIELD | Admitting: Internal Medicine

## 2015-02-08 VITALS — BP 102/66 | HR 69 | Temp 98.4°F | Ht 59.5 in | Wt 92.0 lb

## 2015-02-08 DIAGNOSIS — R51 Headache: Secondary | ICD-10-CM

## 2015-02-08 DIAGNOSIS — J4520 Mild intermittent asthma, uncomplicated: Secondary | ICD-10-CM

## 2015-02-08 DIAGNOSIS — Z0000 Encounter for general adult medical examination without abnormal findings: Secondary | ICD-10-CM

## 2015-02-08 DIAGNOSIS — R519 Headache, unspecified: Secondary | ICD-10-CM

## 2015-02-08 DIAGNOSIS — N946 Dysmenorrhea, unspecified: Secondary | ICD-10-CM

## 2015-02-08 LAB — COMPREHENSIVE METABOLIC PANEL
ALK PHOS: 50 U/L (ref 39–117)
ALT: 11 U/L (ref 0–35)
AST: 16 U/L (ref 0–37)
Albumin: 4.8 g/dL (ref 3.5–5.2)
BILIRUBIN TOTAL: 0.5 mg/dL (ref 0.2–1.2)
BUN: 12 mg/dL (ref 6–23)
CALCIUM: 10 mg/dL (ref 8.4–10.5)
CHLORIDE: 105 meq/L (ref 96–112)
CO2: 30 meq/L (ref 19–32)
CREATININE: 0.89 mg/dL (ref 0.40–1.20)
GFR: 85.62 mL/min (ref >60.00)
GLUCOSE: 98 mg/dL (ref 70–99)
Potassium: 4.7 mEq/L (ref 3.5–5.1)
SODIUM: 138 meq/L (ref 135–145)
TOTAL PROTEIN: 7.3 g/dL (ref 6.0–8.3)

## 2015-02-08 LAB — LIPID PANEL
CHOL/HDL RATIO: 2
Cholesterol: 124 mg/dL (ref 0–200)
HDL: 58.7 mg/dL (ref >39.00)
LDL CALC: 57 mg/dL (ref 0–99)
NonHDL: 65.3
TRIGLYCERIDES: 44 mg/dL (ref 0.0–149.0)
VLDL: 8.8 mg/dL (ref 0.0–40.0)

## 2015-02-08 LAB — CBC
HCT: 38.8 % (ref 36.0–46.0)
HEMOGLOBIN: 13.2 g/dL (ref 12.0–15.0)
MCHC: 33.9 g/dL (ref 30.0–36.0)
MCV: 82.3 fl (ref 78.0–100.0)
Platelets: 245 10*3/uL (ref 150.0–400.0)
RBC: 4.71 Mil/uL (ref 3.87–5.11)
RDW: 13.2 % (ref 11.5–14.6)
WBC: 6.5 10*3/uL (ref 4.5–10.5)

## 2015-02-08 NOTE — Assessment & Plan Note (Signed)
Will check CBC today Is seems like she would benefit from hormonal therapy, but not OCP due to side effect of daily headache She is interested in the Mirena IUD Advised her that the [facility] may be a better option given her frame and the fact that she has not had children She will self refer to gyn Advised her to try Motrin or Midol for cramp relief

## 2015-02-08 NOTE — Patient Instructions (Signed)
Levonorgestrel intrauterine device (IUD) What is this medicine? LEVONORGESTREL IUD (LEE voe nor jes trel) is a contraceptive (birth control) device. The device is placed inside the uterus by a healthcare professional. It is used to prevent pregnancy and can also be used to treat heavy bleeding that occurs during your period. Depending on the device, it can be used for 3 to 5 years. This medicine may be used for other purposes; ask your health care provider or pharmacist if you have questions. COMMON BRAND NAME(S): LILETTA, Mirena, Skyla What should I tell my health care provider before I take this medicine? They need to know if you have any of these conditions: -abnormal Pap smear -cancer of the breast, uterus, or cervix -diabetes -endometritis -genital or pelvic infection now or in the past -have more than one sexual partner or your partner has more than one partner -heart disease -history of an ectopic or tubal pregnancy -immune system problems -IUD in place -liver disease or tumor -problems with blood clots or take blood-thinners -use intravenous drugs -uterus of unusual shape -vaginal bleeding that has not been explained -an unusual or allergic reaction to levonorgestrel, other hormones, silicone, or polyethylene, medicines, foods, dyes, or preservatives -pregnant or trying to get pregnant -breast-feeding How should I use this medicine? This device is placed inside the uterus by a health care professional. Talk to your pediatrician regarding the use of this medicine in children. Special care may be needed. Overdosage: If you think you have taken too much of this medicine contact a poison control center or emergency room at once. NOTE: This medicine is only for you. Do not share this medicine with others. What if I miss a dose? This does not apply. What may interact with this medicine? Do not take this medicine with any of the following  medications: -amprenavir -bosentan -fosamprenavir This medicine may also interact with the following medications: -aprepitant -barbiturate medicines for inducing sleep or treating seizures -bexarotene -griseofulvin -medicines to treat seizures like carbamazepine, ethotoin, felbamate, oxcarbazepine, phenytoin, topiramate -modafinil -pioglitazone -rifabutin -rifampin -rifapentine -some medicines to treat HIV infection like atazanavir, indinavir, lopinavir, nelfinavir, tipranavir, ritonavir -St. John's wort -warfarin This list may not describe all possible interactions. Give your health care provider a list of all the medicines, herbs, non-prescription drugs, or dietary supplements you use. Also tell them if you smoke, drink alcohol, or use illegal drugs. Some items may interact with your medicine. What should I watch for while using this medicine? Visit your doctor or health care professional for regular check ups. See your doctor if you or your partner has sexual contact with others, becomes HIV positive, or gets a sexual transmitted disease. This product does not protect you against HIV infection (AIDS) or other sexually transmitted diseases. You can check the placement of the IUD yourself by reaching up to the top of your vagina with clean fingers to feel the threads. Do not pull on the threads. It is a good habit to check placement after each menstrual period. Call your doctor right away if you feel more of the IUD than just the threads or if you cannot feel the threads at all. The IUD may come out by itself. You may become pregnant if the device comes out. If you notice that the IUD has come out use a backup birth control method like condoms and call your health care provider. Using tampons will not change the position of the IUD and are okay to use during your period. What side effects may   I notice from receiving this medicine? Side effects that you should report to your doctor or  health care professional as soon as possible: -allergic reactions like skin rash, itching or hives, swelling of the face, lips, or tongue -fever, flu-like symptoms -genital sores -high blood pressure -no menstrual period for 6 weeks during use -pain, swelling, warmth in the leg -pelvic pain or tenderness -severe or sudden headache -signs of pregnancy -stomach cramping -sudden shortness of breath -trouble with balance, talking, or walking -unusual vaginal bleeding, discharge -yellowing of the eyes or skin Side effects that usually do not require medical attention (report to your doctor or health care professional if they continue or are bothersome): -acne -breast pain -change in sex drive or performance -changes in weight -cramping, dizziness, or faintness while the device is being inserted -headache -irregular menstrual bleeding within first 3 to 6 months of use -nausea This list may not describe all possible side effects. Call your doctor for medical advice about side effects. You may report side effects to FDA at 1-800-FDA-1088. Where should I keep my medicine? This does not apply. NOTE: This sheet is a summary. It may not cover all possible information. If you have questions about this medicine, talk to your doctor, pharmacist, or health care provider.  2015, Elsevier/Gold Standard. (2011-12-18 13:54:04)  

## 2015-02-08 NOTE — Progress Notes (Signed)
Pre visit review using our clinic review tool, if applicable. No additional management support is needed unless otherwise documented below in the visit note. 

## 2015-02-08 NOTE — Progress Notes (Signed)
HPI  Pt presents to the clinic today to establish care and for management of the conditions listed below. She is transferring care from Dr. Juleen China at [facility] in [place]. She would like her physical exam today if she could get it.  Asthma: Mild, intermittent. Triggered by changes in the season. She does not take an antihistamine OTC. She does not even use an albuterol inhaler.  Frequent Headaches: Headaches were being caused by OCP. Headaches stopped when she stopped her OCP. She does not remember the name of the OCP she was taking.  She also has some concerns today with c/o pain with menses. She also has rectal pain during her menses (only with BM). She was on OCP in the past and they did help her symptoms but caused frequent headaches. Her LMP was 42/28 and lasted 5-7 days. She reports she is getting her menses every 3 weeks, which is not usual for her. Her bleeding is not heavy but she has severe cramping. She is sexually active. She uses condoms for birth control. She has never had a pap smear.  Past Medical History  Diagnosis Date  . Asthma   . Chicken pox   . Frequent headaches     Current Outpatient Prescriptions  Medication Sig Dispense Refill  . ibuprofen (ADVIL,MOTRIN) 200 MG tablet Take 200 mg by mouth as needed.     No current facility-administered medications for this visit.    Allergies  Allergen Reactions  . Penicillins Hives    History reviewed. No pertinent family history.  History   Social History  . Marital Status: Married    Spouse Name: N/A  . Number of Children: N/A  . Years of Education: N/A   Occupational History  . Not on file.   Social History Main Topics  . Smoking status: Never Smoker   . Smokeless tobacco: Never Used  . Alcohol Use: 0.0 oz/week    0 Standard drinks or equivalent per week     Comment: occasional  . Drug Use: No  . Sexual Activity: Not on file   Other Topics Concern  . Not on file   Social History  Narrative   Patient lives at home with her parents.    Patient works at [employer].   Patient is single.    Patient has a high school education.    Patient has no children.     ROS:  Constitutional: Denies fever, malaise, fatigue, headache or abrupt weight changes.  HEENT: Denies eye pain, eye redness, ear pain, ringing in the ears, wax buildup, runny nose, nasal congestion, bloody nose, or sore throat. Respiratory: Denies difficulty breathing, shortness of breath, cough or sputum production.   Cardiovascular: Denies chest pain, chest tightness, palpitations or swelling in the hands or feet.  Gastrointestinal: Pt reports abdominal cramping. Denies bloating, constipation, diarrhea or blood in the stool.  GU: Denies frequency, urgency, pain with urination, blood in urine, odor or discharge. Musculoskeletal: Denies decrease in range of motion, difficulty with gait, muscle pain or joint pain and swelling.  Skin: Pt reports acne on face. Denies ulcercations.  Neurological: Denies dizziness, difficulty with memory, difficulty with speech or problems with balance and coordination.   No other specific complaints in a complete review of systems (except as listed in HPI above).  PE:  Pulse 69  Temp(Src) 98.4 F (36.9 C) (Oral)  Ht 4' 11.5" (1.511 m)  Wt 92 lb (41.731 kg)  BMI 18.28 kg/m2  SpO2 99%  LMP 01/31/2014 Wt Readings  from Last 3 Encounters:  02/08/15 92 lb (41.731 kg)  08/03/13 95 lb (43.092 kg) (1 %*, Z = -2.24)  07/08/13 92 lb (41.731 kg) (1 %*, Z = -2.57)   * Growth percentiles are based on CDC 2-20 Years data.    General: Appears her stated age, well developed, well nourished in NAD. Skin: Warm, dry and intact. [place]edone acne noted on face. HEENT: Head: normal shape and size; Eyes: sclera white, no icterus, conjunctiva pink, PERRLA and EOMs intact; Ears: Tm's gray and intact, normal light reflex; Throat/Mouth: Teeth present, mucosa pink and moist, no lesions or ulcerations  noted.  Neck:  Neck supple, trachea midline. No masses, lumps or thyromegaly present.  Cardiovascular: Normal rate and rhythm. S1,S2 noted.  No murmur, rubs or gallops noted. No JVD or BLE edema.  Pulmonary/Chest: Normal effort and positive vesicular breath sounds. No respiratory distress. No wheezes, rales or ronchi noted.  Abdomen: Soft and nontender. Normal bowel sounds, no bruits noted. No distention or masses noted. Liver, spleen and kidneys non palpable. Musculoskeletal: Normal range of motion. Strength 5/5 BUE/BLE. No difficulty with gait.  Neurological: Alert and oriented. Cranial nerves II-XII grossly intact. Coordination normal.  Psychiatric: Mood and affect normal. Behavior is normal. Judgment and thought content normal.    BMET    Component Value Date/Time   NA 143 07/10/2013 1926   K 3.6 07/10/2013 1926   CL 112 07/10/2013 1926   CO2 24 07/10/2013 1926   GLUCOSE 118* 07/10/2013 1926   BUN 6 07/10/2013 1926   CREATININE 0.79 07/10/2013 1926   CALCIUM 9.1 07/10/2013 1926   GFRNONAA >90 07/10/2013 1926   GFRAA >90 07/10/2013 1926    Lipid Panel  No results found for: CHOL, TRIG, HDL, CHOLHDL, VLDL, LDLCALC  CBC    Component Value Date/Time   WBC 5.8 07/10/2013 1926   RBC 4.43 07/10/2013 1926   HGB 12.5 07/10/2013 1926   HCT 36.1 07/10/2013 1926   PLT 235 07/10/2013 1926   MCV 81.5 07/10/2013 1926   MCH 28.2 07/10/2013 1926   MCHC 34.6 07/10/2013 1926   RDW 13.1 07/10/2013 1926   LYMPHSABS 0.9 07/10/2013 1926   MONOABS 0.1 07/10/2013 1926   EOSABS 0.0 07/10/2013 1926   BASOSABS 0.0 07/10/2013 1926    Hgb A1C No results found for: HGBA1C   Assessment and Plan:  Preventative Health Maintenance:  She declines flu shot NCIR reviewed and all vaccinations UTD including HPV Will check CBC, CMET and Lipid Profile today  RTC in 1 year or sooner if needed

## 2015-02-08 NOTE — Assessment & Plan Note (Signed)
No flare recently Advised her if it does flare up, start Allegra OTC She declines RX for albuterol inhaler today

## 2015-02-08 NOTE — Assessment & Plan Note (Signed)
Improved since she has been off her OCP Will continue to monitor for now

## 2015-08-31 ENCOUNTER — Encounter: Payer: Self-pay | Admitting: Primary Care

## 2015-08-31 ENCOUNTER — Ambulatory Visit (INDEPENDENT_AMBULATORY_CARE_PROVIDER_SITE_OTHER): Payer: BLUE CROSS/BLUE SHIELD | Admitting: Primary Care

## 2015-08-31 VITALS — BP 98/62 | HR 69 | Temp 98.1°F | Ht 59.5 in | Wt 91.0 lb

## 2015-08-31 DIAGNOSIS — R350 Frequency of micturition: Secondary | ICD-10-CM

## 2015-08-31 DIAGNOSIS — N390 Urinary tract infection, site not specified: Secondary | ICD-10-CM

## 2015-08-31 LAB — POCT URINALYSIS DIPSTICK
Blood, UA: NEGATIVE
Glucose, UA: NEGATIVE
NITRITE UA: POSITIVE
PH UA: 5.5
Spec Grav, UA: >=1.03
Urobilinogen, UA: 2

## 2015-08-31 MED ORDER — SULFAMETHOXAZOLE-TRIMETHOPRIM 800-160 MG PO TABS: 1.0000 | ORAL_TABLET | Freq: Two times a day (BID) | ORAL | Status: DC

## 2015-08-31 NOTE — Patient Instructions (Signed)
Start Bactrim DS antibiotic. Take 1 tablet by mouth twice daily for 5 days.  Increase consumption of water.  Please call me if no improvement once antibiotics are complete.  It was a pleasure meeting you!  Urinary Tract Infection Urinary tract infections (UTIs) can develop anywhere along your urinary tract. Your urinary tract is your body's drainage system for removing wastes and extra water. Your urinary tract includes two kidneys, two ureters, a bladder, and a urethra. Your kidneys are a pair of bean-shaped organs. Each kidney is about the size of your fist. They are located below your ribs, one on each side of your spine. CAUSES Infections are caused by microbes, which are microscopic organisms, including fungi, viruses, and bacteria. These organisms are so small that they can only be seen through a microscope. Bacteria are the microbes that most commonly cause UTIs. SYMPTOMS  Symptoms of UTIs may vary by age and gender of the patient and by the location of the infection. Symptoms in young women typically include a frequent and intense urge to urinate and a painful, burning feeling in the bladder or urethra during urination. Older women and men are more likely to be tired, shaky, and weak and have muscle aches and abdominal pain. A fever may mean the infection is in your kidneys. Other symptoms of a kidney infection include pain in your back or sides below the ribs, nausea, and vomiting. DIAGNOSIS To diagnose a UTI, your caregiver will ask you about your symptoms. Your caregiver also will ask to provide a urine sample. The urine sample will be tested for bacteria and white blood cells. White blood cells are made by your body to help fight infection. TREATMENT  Typically, UTIs can be treated with medication. Because most UTIs are caused by a bacterial infection, they usually can be treated with the use of antibiotics. The choice of antibiotic and length of treatment depend on your symptoms and the  type of bacteria causing your infection. HOME CARE INSTRUCTIONS  If you were prescribed antibiotics, take them exactly as your caregiver instructs you. Finish the medication even if you feel better after you have only taken some of the medication.  Drink enough water and fluids to keep your urine clear or pale yellow.  Avoid caffeine, tea, and carbonated beverages. They tend to irritate your bladder.  Empty your bladder often. Avoid holding urine for long periods of time.  Empty your bladder before and after sexual intercourse.  After a bowel movement, women should cleanse from front to back. Use each tissue only once. SEEK MEDICAL CARE IF:   You have back pain.  You develop a fever.  Your symptoms do not begin to resolve within 3 days. SEEK IMMEDIATE MEDICAL CARE IF:   You have severe back pain or lower abdominal pain.  You develop chills.  You have nausea or vomiting.  You have continued burning or discomfort with urination. MAKE SURE YOU:   Understand these instructions.  Will watch your condition.  Will get help right away if you are not doing well or get worse. Document Released: 08/27/2005 Document Revised: 05/18/2012 Document Reviewed: 12/26/2011 [facility] Patient Information 2015 [place], [place]. This information is not intended to replace advice given to you by your health care provider. Make sure you discuss any questions you have with your health care provider.

## 2015-08-31 NOTE — Progress Notes (Signed)
Pre visit review using our clinic review tool, if applicable. No additional management support is needed unless otherwise documented below in the visit note. 

## 2015-08-31 NOTE — Progress Notes (Signed)
   Subjective:    Patient ID: Cynthia Klein, female    DOB: 16-Mar-1996, 19 y.o.   MRN: [id number]  HPI  Cynthia Klein is a 19 year old female who presents today with a chief complaint of urinary frequency. She also reports foul odor, low back pain, and discomfort during urination. Her symptoms began about 1 week ago and have progressed starting last night. She's tried taking AZO without relief and advil with reduction in her discomfort. Denies fevers and vaginal symptoms.  Review of Systems  Constitutional: Positive for chills. Negative for fever.  Genitourinary: Positive for dysuria, urgency, frequency and flank pain. Negative for hematuria and vaginal discharge.       Past Medical History  Diagnosis Date  . Asthma   . Chicken pox   . Frequent headaches     Social History   Social History  . Marital Status: Married    Spouse Name: N/A  . Number of Children: N/A  . Years of Education: N/A   Occupational History  . Not on file.   Social History Main Topics  . Smoking status: Never Smoker   . Smokeless tobacco: Never Used  . Alcohol Use: 0.0 oz/week    0 Standard drinks or equivalent per week     Comment: occasional  . Drug Use: No  . Sexual Activity: Yes   Other Topics Concern  . Not on file   Social History Narrative   Patient lives at home with her parents.    Patient works at [employer].   Patient is single.    Patient has a high school education.    Patient has no children.     Past Surgical History  Procedure Laterality Date  . Wisdom tooth extraction      Family History  Problem Relation Age of Onset  . Cancer Paternal Uncle     skin  . Diabetes Neg Hx   . Stroke Neg Hx   . Heart disease Neg Hx     Allergies  Allergen Reactions  . Penicillins Hives    Current Outpatient Prescriptions on File Prior to Visit  Medication Sig Dispense Refill  . ibuprofen (ADVIL,MOTRIN) 200 MG tablet Take 200 mg by mouth as needed.     No current  facility-administered medications on file prior to visit.    BP 98/62 mmHg  Pulse 69  Temp(Src) 98.1 F (36.7 C) (Oral)  Ht 4' 11.5" (1.511 m)  Wt 91 lb (41.277 kg)  BMI 18.08 kg/m2  SpO2 99%  LMP 08/28/2015     Objective:   Physical Exam  Constitutional: She appears well-nourished.  Cardiovascular: Normal rate and regular rhythm.   Pulmonary/Chest: Effort normal and breath sounds normal.  Abdominal: Soft. There is tenderness in the suprapubic area. There is no CVA tenderness.  Skin: Skin is warm and dry.  Psychiatric: She has a normal mood and affect.          Assessment & Plan:  Urinary Tract Infection:  Urinary frequency, foul odor, chills x 1 week, worse starting last night. UA: Positive for leuks, nitrites. Negative for blood. RX for bactrim DS tablets BID x 5 days. Culture sent. Push fluids, rest, follow up PRN.

## 2015-09-02 LAB — URINE CULTURE
Colony Count: NO GROWTH
Organism ID, Bacteria: NO GROWTH

## 2015-09-06 ENCOUNTER — Telehealth: Payer: Self-pay | Admitting: Internal Medicine

## 2015-09-06 NOTE — Telephone Encounter (Signed)
Did she experience any improvement in her symptoms while taking the antibiotic?

## 2015-09-06 NOTE — Telephone Encounter (Signed)
Pt called stating she saw Cynthia Klein 9/30 for uti was given rx she has taken all her rx She still goes to the bathroom every 5 minutes.   Pt wanted to know what she needs to do Please advise  [facility] [place]

## 2015-09-06 NOTE — Telephone Encounter (Signed)
Called patient and she stated she still urge to go often with lots of discomforted. Offer the patient to be evaluated in the office. Patient have apt with Anda Kraft on 09/07/15 at 8:45 am

## 2015-09-07 ENCOUNTER — Encounter: Payer: Self-pay | Admitting: Primary Care

## 2015-09-07 ENCOUNTER — Ambulatory Visit (INDEPENDENT_AMBULATORY_CARE_PROVIDER_SITE_OTHER): Payer: BLUE CROSS/BLUE SHIELD | Admitting: Primary Care

## 2015-09-07 VITALS — BP 114/70 | HR 68 | Temp 97.9°F | Ht 59.5 in | Wt 88.0 lb

## 2015-09-07 DIAGNOSIS — R350 Frequency of micturition: Secondary | ICD-10-CM

## 2015-09-07 DIAGNOSIS — R109 Unspecified abdominal pain: Secondary | ICD-10-CM | POA: Diagnosis not present

## 2015-09-07 LAB — POCT URINALYSIS DIPSTICK
Bilirubin, UA: NEGATIVE
Glucose, UA: NEGATIVE
KETONES UA: NEGATIVE
Nitrite, UA: NEGATIVE
PH UA: 6
RBC UA: NEGATIVE
Urobilinogen, UA: NEGATIVE

## 2015-09-07 MED ORDER — CIPROFLOXACIN HCL 500 MG PO TABS: 500.0000 mg | ORAL_TABLET | Freq: Two times a day (BID) | ORAL | Status: DC

## 2015-09-07 NOTE — Progress Notes (Signed)
Pre visit review using our clinic review tool, if applicable. No additional management support is needed unless otherwise documented below in the visit note. 

## 2015-09-07 NOTE — Progress Notes (Signed)
Subjective:    Patient ID: Cynthia Klein, female    DOB: 11/30/1996, 19 y.o.   MRN: [id number]  HPI  Cynthia Klein is a 19 year old female who presents today for follow up of UTI. She was evaluated on 08/31/15 for complaints of urinary frequency, low back pain, and discomfort during urination. Her UA was positive for leuks, nitrites, blood. Urine culture did not show growth. She was initated on Bactrim DS tablets for 5 days.  Since completion of antibiotics she continues to experience urinary frequency, especially  when laying down, about every 5 minutes. She's had a reduction in her flank pain overall. Denies fevers, hematuria, vaginal discharge, vaginal itching.   Review of Systems  Constitutional: Negative for fever and chills.  Gastrointestinal: Negative for abdominal pain.  Genitourinary: Positive for frequency and flank pain. Negative for dysuria, hematuria, vaginal discharge and difficulty urinating.       Past Medical History  Diagnosis Date  . Asthma   . Chicken pox   . Frequent headaches     Social History   Social History  . Marital Status: Married    Spouse Name: N/A  . Number of Children: N/A  . Years of Education: N/A   Occupational History  . Not on file.   Social History Main Topics  . Smoking status: Never Smoker   . Smokeless tobacco: Never Used  . Alcohol Use: 0.0 oz/week    0 Standard drinks or equivalent per week     Comment: occasional  . Drug Use: No  . Sexual Activity: Yes   Other Topics Concern  . Not on file   Social History Narrative   Patient lives at home with her parents.    Patient works at [employer].   Patient is single.    Patient has a high school education.    Patient has no children.     Past Surgical History  Procedure Laterality Date  . Wisdom tooth extraction      Family History  Problem Relation Age of Onset  . Cancer Paternal Uncle     skin  . Diabetes Neg Hx   . Stroke Neg Hx   . Heart disease Neg Hx      Allergies  Allergen Reactions  . Penicillins Hives    Current Outpatient Prescriptions on File Prior to Visit  Medication Sig Dispense Refill  . ibuprofen (ADVIL,MOTRIN) 200 MG tablet Take 200 mg by mouth as needed.     No current facility-administered medications on file prior to visit.    BP 114/70 mmHg  Pulse 68  Temp(Src) 97.9 F (36.6 C) (Oral)  Ht 4' 11.5" (1.511 m)  Wt 88 lb (39.917 kg)  BMI 17.48 kg/m2  SpO2 99%  LMP 08/28/2015    Objective:   Physical Exam  Constitutional: She appears well-nourished.  Cardiovascular: Normal rate and regular rhythm.   Pulmonary/Chest: Effort normal and breath sounds normal.  Abdominal: Soft. Bowel sounds are normal. There is tenderness in the suprapubic area. There is no CVA tenderness.  Skin: Skin is warm and dry.          Assessment & Plan:  UTI:  Treated one week ago for UTI as evidenced by moderate leuks, blood, and nitirites. Culture returned without growth; however, treated empirically based off of symptoms and UA. Some improvement, still has frequency. UA with trace leuks today. No nitrites, no blood. Will treat with cipro course and send new culture. She does not appear sickly, no cva  tenderness. Push fluids. She declines KUB today for evaluation of stones. Follow up if no improvement by Monday next week.

## 2015-09-07 NOTE — Patient Instructions (Addendum)
Start Ciprofloxacin antibiotic. Take 1 tablet by mouth twice daily for 7 days.  Complete xray(s) prior to leaving today. I will contact you regarding your results.  Call me if no improvement in your symptoms by Monday next week.  It was a pleasure to see you today!

## 2015-09-08 LAB — URINE CULTURE
Colony Count: NO GROWTH
Organism ID, Bacteria: NO GROWTH

## 2015-09-11 ENCOUNTER — Ambulatory Visit: Payer: BLUE CROSS/BLUE SHIELD | Admitting: Primary Care

## 2015-09-21 ENCOUNTER — Telehealth: Payer: Self-pay

## 2015-09-21 NOTE — Telephone Encounter (Signed)
She is not having UTI's. Her urine cultures are not growing out any bacteria. If symptoms persist, she needs to follow up with me to discuss.

## 2015-09-21 NOTE — Telephone Encounter (Signed)
Pt has appt scheduled for 09/24/15

## 2015-09-21 NOTE — Telephone Encounter (Signed)
Pt called stating she wants to know what she should do as she has continued to have UTIs---she is concerned--please advise

## 2015-09-24 ENCOUNTER — Ambulatory Visit (INDEPENDENT_AMBULATORY_CARE_PROVIDER_SITE_OTHER): Payer: BLUE CROSS/BLUE SHIELD | Admitting: Internal Medicine

## 2015-09-24 ENCOUNTER — Other Ambulatory Visit (HOSPITAL_COMMUNITY)
Admission: RE | Admit: 2015-09-24 | Discharge: 2015-09-24 | Disposition: A | Discharge status: Home / Self Care | Payer: BLUE CROSS/BLUE SHIELD | Source: Ambulatory Visit | Attending: Family Medicine | Admitting: Family Medicine

## 2015-09-24 ENCOUNTER — Encounter: Payer: Self-pay | Admitting: Internal Medicine

## 2015-09-24 VITALS — BP 102/70 | HR 78 | Temp 98.4°F | Wt 89.0 lb

## 2015-09-24 DIAGNOSIS — R3915 Urgency of urination: Secondary | ICD-10-CM

## 2015-09-24 DIAGNOSIS — R350 Frequency of micturition: Secondary | ICD-10-CM | POA: Diagnosis not present

## 2015-09-24 DIAGNOSIS — M546 Pain in thoracic spine: Secondary | ICD-10-CM | POA: Diagnosis not present

## 2015-09-24 DIAGNOSIS — Z01419 Encounter for gynecological examination (general) (routine) without abnormal findings: Secondary | ICD-10-CM | POA: Insufficient documentation

## 2015-09-24 LAB — POCT URINALYSIS DIPSTICK
Bilirubin, UA: NEGATIVE
Blood, UA: NEGATIVE
Glucose, UA: NEGATIVE
Ketones, UA: NEGATIVE
Leukocytes, UA: NEGATIVE
Nitrite, UA: NEGATIVE
PH UA: 7
SPEC GRAV UA: 1.02
UROBILINOGEN UA: NEGATIVE

## 2015-09-24 NOTE — Progress Notes (Signed)
Subjective:    Patient ID: Cynthia Klein, female    DOB: 1996-07-08, 19 y.o.   MRN: [id number]  HPI  Pt presents to the clinic today with c/o ongoing urinary frequency, urgency and low back pain. This has been going on for the last month. She saw Allie Bossier 9/30 and 10/7 for the same. Urine cultures at that time did not grow any bacteria, but she was treated with 2 rounds of antibiotics based on her symptoms and urinalysis. She denies vaginal discharge or odor. The pain in her back is in the mid thoracic/lumbar area. The pain radiates to her abdomen. She describes the pain as sharp and stabbing. The pain is worse with movement. She denies any injury to the area. Her LMP was 10/16. Her periods are regular. She has never had a pap smear. She has no family history of kidney stones. She does have a family history of DM 2.  Review of Systems      Past Medical History  Diagnosis Date  . Asthma   . Chicken pox   . Frequent headaches     Current Outpatient Prescriptions  Medication Sig Dispense Refill  . ibuprofen (ADVIL,MOTRIN) 200 MG tablet Take 200 mg by mouth as needed.     No current facility-administered medications for this visit.    Allergies  Allergen Reactions  . Penicillins Hives    Family History  Problem Relation Age of Onset  . Cancer Paternal Uncle     skin  . Diabetes Neg Hx   . Stroke Neg Hx   . Heart disease Neg Hx     Social History   Social History  . Marital Status: Married    Spouse Name: N/A  . Number of Children: N/A  . Years of Education: N/A   Occupational History  . Not on file.   Social History Main Topics  . Smoking status: Never Smoker   . Smokeless tobacco: Never Used  . Alcohol Use: 0.0 oz/week    0 Standard drinks or equivalent per week     Comment: occasional  . Drug Use: No  . Sexual Activity: Yes   Other Topics Concern  . Not on file   Social History Narrative   Patient lives at home with her parents.    Patient works at  [employer].   Patient is single.    Patient has a high school education.    Patient has no children.      Constitutional: Denies fever, malaise, fatigue, headache or abrupt weight changes.  Gastrointestinal: Denies abdominal pain, bloating, constipation, diarrhea or blood in the stool.  GU: Pt reports urinary urgency and frequency. Denies pain with urination, burning sensation, blood in urine, odor or discharge. Musculoskeletal: Pt reports back pain. Denies decrease in range of motion, difficulty with gait,  or joint pain and swelling.  Skin: Denies redness, rashes, lesions or ulcercations.   No other specific complaints in a complete review of systems (except as listed in HPI above).  Objective:   Physical Exam   BP 102/70 mmHg  Pulse 78  Temp(Src) 98.4 F (36.9 C) (Oral)  Wt 89 lb (40.37 kg)  SpO2 99%  LMP 09/16/2015 Wt Readings from Last 3 Encounters:  09/24/15 89 lb (40.37 kg)  09/07/15 88 lb (39.917 kg)  08/31/15 91 lb (41.277 kg)    General: Appears her stated age, well developed, well nourished in NAD. Cardiovascular: Normal rate and rhythm. S1,S2 noted.  No murmur, rubs or gallops noted.  Pulmonary/Chest: Normal effort and positive vesicular breath sounds. No respiratory distress. No wheezes, rales or ronchi noted.  Abdomen: Soft and nontender. Normal bowel sounds. No distention or masses noted. No CVA tenderness. Pelvic: Normal female anatomy. Cervix friable. No discharge or CMT noted. Adnexa nonpalpable but she does have pain with bimanual exam. Musculoskeletal: Normal flexion and extension. Pain with rotation of the spine. No bony tenderness noted. No pain with palpation of the parathoracic muscles. Neurological: Alert and oriented.    BMET    Component Value Date/Time   NA 138 02/08/2015 1132   K 4.7 02/08/2015 1132   CL 105 02/08/2015 1132   CO2 30 02/08/2015 1132   GLUCOSE 98 02/08/2015 1132   BUN 12 02/08/2015 1132   CREATININE 0.89 02/08/2015 1132    CALCIUM 10.0 02/08/2015 1132   GFRNONAA >90 07/10/2013 1926   GFRAA >90 07/10/2013 1926    Lipid Panel     Component Value Date/Time   CHOL 124 02/08/2015 1132   TRIG 44.0 02/08/2015 1132   HDL 58.70 02/08/2015 1132   CHOLHDL 2 02/08/2015 1132   VLDL 8.8 02/08/2015 1132   LDLCALC 57 02/08/2015 1132    CBC    Component Value Date/Time   WBC 6.5 02/08/2015 1132   RBC 4.71 02/08/2015 1132   HGB 13.2 02/08/2015 1132   HCT 38.8 02/08/2015 1132   PLT 245.0 02/08/2015 1132   MCV 82.3 02/08/2015 1132   MCH 28.2 07/10/2013 1926   MCHC 33.9 02/08/2015 1132   RDW 13.2 02/08/2015 1132   LYMPHSABS 0.9 07/10/2013 1926   MONOABS 0.1 07/10/2013 1926   EOSABS 0.0 07/10/2013 1926   BASOSABS 0.0 07/10/2013 1926    Hgb A1C No results found for: HGBA1C      Assessment & Plan:   Urinary frequency and urgency:  Urinalysis: trace protein Pap Smear today- exam benign Will check CBC, CMET, TSH and A1C  Back pain:  MSK related Take Ibuprofen as needed Stretching exercises given  Will follow up after labs, RTC as needed

## 2015-09-24 NOTE — Progress Notes (Signed)
Pre visit review using our clinic review tool, if applicable. No additional management support is needed unless otherwise documented below in the visit note. 

## 2015-09-24 NOTE — Patient Instructions (Signed)

## 2015-09-25 LAB — CBC
HCT: 37.6 % (ref 36.0–46.0)
Hemoglobin: 12.5 g/dL (ref 12.0–15.0)
MCHC: 33.2 g/dL (ref 30.0–36.0)
MCV: 83 fl (ref 78.0–100.0)
PLATELETS: 278 10*3/uL (ref 150.0–400.0)
RBC: 4.53 Mil/uL (ref 3.87–5.11)
RDW: 13.9 % (ref 11.5–15.5)
WBC: 7.3 10*3/uL (ref 4.0–10.5)

## 2015-09-25 LAB — COMPREHENSIVE METABOLIC PANEL
ALT: 11 U/L (ref 0–35)
AST: 18 U/L (ref 0–37)
Albumin: 4.5 g/dL (ref 3.5–5.2)
Alkaline Phosphatase: 53 U/L (ref 39–117)
BILIRUBIN TOTAL: 0.2 mg/dL (ref 0.2–1.2)
BUN: 11 mg/dL (ref 6–23)
CALCIUM: 9.9 mg/dL (ref 8.4–10.5)
CHLORIDE: 105 meq/L (ref 96–112)
CO2: 29 meq/L (ref 19–32)
CREATININE: 0.82 mg/dL (ref 0.40–1.20)
GFR: 93.53 mL/min (ref >60.00)
GLUCOSE: 87 mg/dL (ref 70–99)
Potassium: 4.3 mEq/L (ref 3.5–5.1)
SODIUM: 140 meq/L (ref 135–145)
Total Protein: 6.9 g/dL (ref 6.0–8.3)

## 2015-09-25 LAB — HEMOGLOBIN A1C: Hgb A1c MFr Bld: 5.3 % (ref 4.6–6.5)

## 2015-09-25 LAB — TSH: TSH: 1.04 u[IU]/mL (ref 0.35–4.50)

## 2015-09-26 LAB — CYTOLOGY - PAP

## 2015-10-01 ENCOUNTER — Ambulatory Visit (INDEPENDENT_AMBULATORY_CARE_PROVIDER_SITE_OTHER): Payer: BLUE CROSS/BLUE SHIELD | Admitting: Internal Medicine

## 2015-10-01 ENCOUNTER — Encounter: Payer: Self-pay | Admitting: Internal Medicine

## 2015-10-01 ENCOUNTER — Telehealth: Payer: Self-pay

## 2015-10-01 VITALS — BP 100/60 | HR 66 | Temp 98.0°F | Wt 91.0 lb

## 2015-10-01 DIAGNOSIS — R109 Unspecified abdominal pain: Secondary | ICD-10-CM

## 2015-10-01 NOTE — Progress Notes (Signed)
Pre visit review using our clinic review tool, if applicable. No additional management support is needed unless otherwise documented below in the visit note. 

## 2015-10-01 NOTE — Progress Notes (Signed)
   Subjective:    Patient ID: Cynthia Klein, female    DOB: Jan 07, 1996, 19 y.o.   MRN: [id number]  HPI Here due to ongoing back pain 4th visit in a month  Had back ache and urinary frequency at first Treated with antibiotic--but still with urgency Got another antibiotic then--- pain hasn't subsided Still with shooting pain in left flank and constant pain in RLQ Back pain persistent--but left side pain comes and goes--more in AM  LMP about 10 days ago Fairly normal  No vomiting but has some nausea Appetite is fairly normal  Current Outpatient Prescriptions on File Prior to Visit  Medication Sig Dispense Refill  . ibuprofen (ADVIL,MOTRIN) 200 MG tablet Take 200 mg by mouth as needed.     No current facility-administered medications on file prior to visit.    Allergies  Allergen Reactions  . Penicillins Hives    Past Medical History  Diagnosis Date  . Asthma   . Chicken pox   . Frequent headaches     Past Surgical History  Procedure Laterality Date  . Wisdom tooth extraction      Family History  Problem Relation Age of Onset  . Cancer Paternal Uncle     skin  . Diabetes Neg Hx   . Stroke Neg Hx   . Heart disease Neg Hx     Social History   Social History  . Marital Status: Married    Spouse Name: N/A  . Number of Children: N/A  . Years of Education: N/A   Occupational History  . Not on file.   Social History Main Topics  . Smoking status: Never Smoker   . Smokeless tobacco: Never Used  . Alcohol Use: 0.0 oz/week    0 Standard drinks or equivalent per week     Comment: occasional  . Drug Use: No  . Sexual Activity: Yes   Other Topics Concern  . Not on file   Social History Narrative   Patient lives at home with her parents.    Patient works at [employer].   Patient is single.    Patient has a high school education.    Patient has no children.    Review of Systems Last intercourse ~6 days ago----always uses condom No hematuria No new work  tasks or known back injury No history of kidney stones or kidney problems    Objective:   Physical Exam  Constitutional: She appears well-developed and well-nourished. No distress.  Abdominal: Soft. She exhibits no distension and no mass. There is no tenderness. There is no rebound and no guarding.  Musculoskeletal:  Bilateral CVA tenderness--mild No spine tenderness SLR negative          Assessment & Plan:

## 2015-10-01 NOTE — Telephone Encounter (Signed)
PLEASE NOTE: All timestamps contained within this report are represented as [place] Standard Time. CONFIDENTIALTY NOTICE: This fax transmission is intended only for the addressee. It contains information that is legally privileged, confidential or otherwise protected from use or disclosure. If you are not the intended recipient, you are strictly prohibited from reviewing, disclosing, copying using or disseminating any of this information or taking any action in reliance on or regarding this information. If you have received this fax in error, please notify us immediately by telephone so that we can arrange for its return to [place]. Phone: [phone], Toll-Free: [phone], Fax: [phone] Page: 1 of 2 Call Id: [id number] [place] Patient Name: Cynthia Klein Gender: Female DOB: Jul 14, 1996 Age: 19 Y 4 D Return Phone Number: [phone] (Primary) Address: City/State/Zip: [place] [facility] [place] Night - Client Client Site [place] - Night Contact Type Call Call Type Triage / Clinical Relationship To Patient Self Return Phone Number [phone] (Primary) Chief Complaint Abdominal Pain Initial Comment Caller states has been in the office; has a constant pain in her back, urinating more, abd pain; already been treated for a UTI DR unknown PreDisposition Home Care Nurse Assessment Nurse: Thad Ranger, RN, Langley Gauss Date/Time (Eastern Time): 09/29/2015 8:27:57 AM Confirm and document reason for call. If symptomatic, describe symptoms. ---Caller states was seen in the office on Monday, but is unable to remember her MD name. She has a constant pain in her lower/ middle back, freq urination, mid lower/middle abd pain on the left side. No blood in urine and denies fever. Has the patient traveled out of the country within the last 30 days? ---Not Applicable Does  the patient have any new or worsening symptoms? ---Yes Will a triage be completed? ---Yes Related visit to physician within the last 2 weeks? ---Yes Does the PT have any chronic conditions? (i.e. diabetes, asthma, etc.) ---No Did the patient indicate they were pregnant? ---No Guidelines Guideline Title Affirmed Question Affirmed Notes Nurse Date/Time Eilene Ghazi Time) Flank Pain [1] SEVERE pain (e.g., excruciating, scale 8-10) AND [2] present > 1 hour Pain is 7.5-8/10 per pt report. Carmon, RN, Denise 09/29/2015 8:32:45 AM Disp. Time Eilene Ghazi Time) Disposition Final User 09/29/2015 8:26:03 AM Attempt made - no message left Carmon, RN, Langley Gauss 09/29/2015 8:35:23 AM Go to ED Now Yes Carmon, RN, Yevette Edwards Understands: Yes Disagree/Comply: Disagree Disagree/Comply Reason: Disagree with instructions PLEASE NOTE: All timestamps contained within this report are represented as [place] Standard Time. CONFIDENTIALTY NOTICE: This fax transmission is intended only for the addressee. It contains information that is legally privileged, confidential or otherwise protected from use or disclosure. If you are not the intended recipient, you are strictly prohibited from reviewing, disclosing, copying using or disseminating any of this information or taking any action in reliance on or regarding this information. If you have received this fax in error, please notify us immediately by telephone so that we can arrange for its return to [place]. Phone: [phone], Toll-Free: [phone], Fax: [phone] Page: 2 of 2 Call Id: [id number] Care Advice Given Per Guideline GO TO ED NOW: You need to be seen in the Emergency Department. Go to the ER at ___________ [place] now. Drive carefully. DRIVING: Another adult should drive. BRING MEDICINES: * Please bring a list of your current medicines when you go to the Emergency Department (ER). CARE ADVICE given per Flank Pain (Adult) guideline. After Care  Instructions Given Call  Event Type User Date / Time Description Comments User: Romeo Apple, RN Date/Time Eilene Ghazi Time): 09/29/2015 8:26:22 AM No [facility] set up yet Referrals GO TO FACILITY REFUSED

## 2015-10-01 NOTE — Assessment & Plan Note (Addendum)
Going on for a month Still with some urinary symptoms-- but 2 antibiotics didn't help (though is better---just still some urgency) Blood work reassuring Doesn't seem to be ovarian or related to cycles ?occult renal calculi  Will check renal ultrasound May want to go back to gyn if ongoing symptoms---just in case

## 2015-10-01 NOTE — Telephone Encounter (Signed)
Pt could not afford to go to ED and still having back pain; pt scheduled appt to see Dr Silvio Pate 10/01/15 at 4:15 PM.

## 2015-10-01 NOTE — Telephone Encounter (Signed)
Will check on her then Reviewed last note

## 2015-10-05 ENCOUNTER — Ambulatory Visit
Admission: RE | Admit: 2015-10-05 | Discharge: 2015-10-05 | Disposition: A | Discharge status: Home / Self Care | Payer: BLUE CROSS/BLUE SHIELD | Source: Ambulatory Visit | Attending: Internal Medicine | Admitting: Internal Medicine

## 2015-10-05 DIAGNOSIS — R109 Unspecified abdominal pain: Secondary | ICD-10-CM | POA: Insufficient documentation

## 2015-11-05 ENCOUNTER — Ambulatory Visit (INDEPENDENT_AMBULATORY_CARE_PROVIDER_SITE_OTHER): Payer: BLUE CROSS/BLUE SHIELD | Admitting: Internal Medicine

## 2015-11-05 ENCOUNTER — Encounter: Payer: Self-pay | Admitting: Internal Medicine

## 2015-11-05 VITALS — BP 106/64 | HR 78 | Temp 98.1°F | Wt 89.0 lb

## 2015-11-05 DIAGNOSIS — R0982 Postnasal drip: Secondary | ICD-10-CM

## 2015-11-05 DIAGNOSIS — R059 Cough, unspecified: Secondary | ICD-10-CM

## 2015-11-05 DIAGNOSIS — J029 Acute pharyngitis, unspecified: Secondary | ICD-10-CM | POA: Diagnosis not present

## 2015-11-05 DIAGNOSIS — R05 Cough: Secondary | ICD-10-CM

## 2015-11-05 LAB — POCT RAPID STREP A (OFFICE): Rapid Strep A Screen: NEGATIVE

## 2015-11-05 NOTE — Progress Notes (Signed)
Pre visit review using our clinic review tool, if applicable. No additional management support is needed unless otherwise documented below in the visit note. 

## 2015-11-05 NOTE — Patient Instructions (Signed)

## 2015-11-05 NOTE — Addendum Note (Signed)
Addended by: Lurlean Nanny on: 11/05/2015 09:53 AM   Modules accepted: Orders

## 2015-11-05 NOTE — Progress Notes (Signed)
Subjective:    Patient ID: Cynthia Klein, female    DOB: 28-Jan-1996, 19 y.o.   MRN: [id number]  HPI  Pt presents to the clinic today with c/o sore throat and cough. This started 3 days. She has had some difficulty swallowing. She denies headache, runny nose, fever, chills or body aches. She has tried salt water gargles and cough drops without relief. She does have a history of asthma but denies seasonal allergies. She has had sick contacts at work. She has not come in contact with anyone she knows that has mono. She denies fatigue, abdominal pain or rash. She does not take flu shots.  Review of Systems      Past Medical History  Diagnosis Date  . Asthma   . Chicken pox   . Frequent headaches     Current Outpatient Prescriptions  Medication Sig Dispense Refill  . ibuprofen (ADVIL,MOTRIN) 200 MG tablet Take 200 mg by mouth as needed.     No current facility-administered medications for this visit.    Allergies  Allergen Reactions  . Penicillins Hives    Family History  Problem Relation Age of Onset  . Cancer Paternal Uncle     skin  . Diabetes Neg Hx   . Stroke Neg Hx   . Heart disease Neg Hx     Social History   Social History  . Marital Status: Married    Spouse Name: N/A  . Number of Children: N/A  . Years of Education: N/A   Occupational History  . Not on file.   Social History Main Topics  . Smoking status: Never Smoker   . Smokeless tobacco: Never Used  . Alcohol Use: 0.0 oz/week    0 Standard drinks or equivalent per week     Comment: occasional  . Drug Use: No  . Sexual Activity: Yes   Other Topics Concern  . Not on file   Social History Narrative   Patient lives at home with her parents.    Patient works at [employer].   Patient is single.    Patient has a high school education.    Patient has no children.      Constitutional: Denies fever, malaise, fatigue, headache or abrupt weight changes.  HEENT: Pt reports sore throat. Denies eye  pain, eye redness, ear pain, ringing in the ears, wax buildup, runny nose, nasal congestion, bloody nose. Respiratory: Pt reports cough. Denies difficulty breathing, shortness of breath, or sputum production.   Cardiovascular: Denies chest pain, chest tightness, palpitations or swelling in the hands or feet.   No other specific complaints in a complete review of systems (except as listed in HPI above).  Objective:   Physical Exam   BP 106/64 mmHg  Pulse 78  Temp(Src) 98.1 F (36.7 C) (Oral)  Wt 89 lb (40.37 kg)  SpO2 98%  LMP 10/09/2015 Wt Readings from Last 3 Encounters:  11/05/15 89 lb (40.37 kg)  10/01/15 91 lb (41.277 kg)  09/24/15 89 lb (40.37 kg)    General: Appears her stated age, well developed, well nourished in NAD. Skin: Warm, dry and intact. No rashes, lesions or ulcerations noted. HEENT: Head: normal shape and size; Throat/Mouth: Teeth present, mucosa erythematous and moist, + PND, no exudate, lesions or ulcerations noted.  Neck:  Anterior cervical adenopathy noted.  Cardiovascular: Normal rate and rhythm. S1,S2 noted.  No murmur, rubs or gallops noted.  Pulmonary/Chest: Normal effort and positive vesicular breath sounds. No respiratory distress. No wheezes, rales or  ronchi noted.   BMET    Component Value Date/Time   NA 140 09/24/2015 1644   K 4.3 09/24/2015 1644   CL 105 09/24/2015 1644   CO2 29 09/24/2015 1644   GLUCOSE 87 09/24/2015 1644   BUN 11 09/24/2015 1644   CREATININE 0.82 09/24/2015 1644   CALCIUM 9.9 09/24/2015 1644   GFRNONAA >90 07/10/2013 1926   GFRAA >90 07/10/2013 1926    Lipid Panel     Component Value Date/Time   CHOL 124 02/08/2015 1132   TRIG 44.0 02/08/2015 1132   HDL 58.70 02/08/2015 1132   CHOLHDL 2 02/08/2015 1132   VLDL 8.8 02/08/2015 1132   LDLCALC 57 02/08/2015 1132    CBC    Component Value Date/Time   WBC 7.3 09/24/2015 1644   RBC 4.53 09/24/2015 1644   HGB 12.5 09/24/2015 1644   HCT 37.6 09/24/2015 1644   PLT  278.0 09/24/2015 1644   MCV 83.0 09/24/2015 1644   MCH 28.2 07/10/2013 1926   MCHC 33.2 09/24/2015 1644   RDW 13.9 09/24/2015 1644   LYMPHSABS 0.9 07/10/2013 1926   MONOABS 0.1 07/10/2013 1926   EOSABS 0.0 07/10/2013 1926   BASOSABS 0.0 07/10/2013 1926    Hgb A1C Lab Results  Component Value Date   HGBA1C 5.3 09/24/2015        Assessment & Plan:   Sore throat, cough secondary to post nasal drip:  RST: negative Will send throat culture Start Zyrtec OTC daily x 1-2 weeks Start Ibuprofen 400 mg TID prn with meals for inflammation Ok to continue salt water gargles Return precautions given  RTC as needed or if symptoms persist or worsen

## 2015-11-07 LAB — CULTURE, GROUP A STREP: Organism ID, Bacteria: NORMAL

## 2015-11-28 ENCOUNTER — Encounter: Payer: Self-pay | Admitting: Internal Medicine

## 2015-11-28 ENCOUNTER — Ambulatory Visit (INDEPENDENT_AMBULATORY_CARE_PROVIDER_SITE_OTHER): Payer: BLUE CROSS/BLUE SHIELD | Admitting: Internal Medicine

## 2015-11-28 VITALS — BP 104/62 | HR 60 | Temp 98.4°F | Wt 88.0 lb

## 2015-11-28 DIAGNOSIS — D171 Benign lipomatous neoplasm of skin and subcutaneous tissue of trunk: Secondary | ICD-10-CM | POA: Diagnosis not present

## 2015-11-28 NOTE — Progress Notes (Signed)
Pre visit review using our clinic review tool, if applicable. No additional management support is needed unless otherwise documented below in the visit note. 

## 2015-11-28 NOTE — Progress Notes (Signed)
Subjective:    Patient ID: Cynthia Klein, female    DOB: 1995-12-11, 19 y.o.   MRN: [id number]  HPI  Pt presents to the clinic today with c/o a lump on her left shoulder blade. She first noticed it about 4 years ago. The lump has gotten bigger in size over time. When she is active, the area is sore to touch. She has never noticed any redness or drainage from the area. She has not tried anything OTC.  Review of Systems      Past Medical History  Diagnosis Date  . Asthma   . Chicken pox   . Frequent headaches     Current Outpatient Prescriptions  Medication Sig Dispense Refill  . ibuprofen (ADVIL,MOTRIN) 200 MG tablet Take 200 mg by mouth as needed.     No current facility-administered medications for this visit.    Allergies  Allergen Reactions  . Penicillins Hives    Family History  Problem Relation Age of Onset  . Cancer Paternal Uncle     skin  . Diabetes Neg Hx   . Stroke Neg Hx   . Heart disease Neg Hx     Social History   Social History  . Marital Status: Married    Spouse Name: N/A  . Number of Children: N/A  . Years of Education: N/A   Occupational History  . Not on file.   Social History Main Topics  . Smoking status: Never Smoker   . Smokeless tobacco: Never Used  . Alcohol Use: 0.0 oz/week    0 Standard drinks or equivalent per week     Comment: occasional  . Drug Use: No  . Sexual Activity: Yes   Other Topics Concern  . Not on file   Social History Narrative   Patient lives at home with her parents.    Patient works at [employer].   Patient is single.    Patient has a high school education.    Patient has no children.      Constitutional: Denies fever, malaise, fatigue, headache or abrupt weight changes.  Respiratory: Denies difficulty breathing, shortness of breath, cough or sputum production.   Cardiovascular: Denies chest pain, chest tightness, palpitations or swelling in the hands or feet.   Skin: Pt reports a lump on her  shoulder. Denies redness, rashes, or ulcercations.    No other specific complaints in a complete review of systems (except as listed in HPI above).  Objective:   Physical Exam  BP 104/62 mmHg  Pulse 60  Temp(Src) 98.4 F (36.9 C) (Oral)  Wt 88 lb (39.917 kg)  SpO2 100%  LMP 11/09/2015 Wt Readings from Last 3 Encounters:  11/28/15 88 lb (39.917 kg)  11/05/15 89 lb (40.37 kg)  10/01/15 91 lb (41.277 kg)    General: Appears her stated age, well developed, well nourished in NAD. Skin: 2 cm round lipoma noted on left upper back. Cardiovascular: Normal rate and rhythm. S1,S2 noted.   Pulmonary/Chest: Normal effort and positive vesicular breath sounds. No respiratory distress. No wheezes, rales or ronchi noted.    BMET    Component Value Date/Time   NA 140 09/24/2015 1644   K 4.3 09/24/2015 1644   CL 105 09/24/2015 1644   CO2 29 09/24/2015 1644   GLUCOSE 87 09/24/2015 1644   BUN 11 09/24/2015 1644   CREATININE 0.82 09/24/2015 1644   CALCIUM 9.9 09/24/2015 1644   GFRNONAA >90 07/10/2013 1926   GFRAA >90 07/10/2013 1926  Lipid Panel     Component Value Date/Time   CHOL 124 02/08/2015 1132   TRIG 44.0 02/08/2015 1132   HDL 58.70 02/08/2015 1132   CHOLHDL 2 02/08/2015 1132   VLDL 8.8 02/08/2015 1132   LDLCALC 57 02/08/2015 1132    CBC    Component Value Date/Time   WBC 7.3 09/24/2015 1644   RBC 4.53 09/24/2015 1644   HGB 12.5 09/24/2015 1644   HCT 37.6 09/24/2015 1644   PLT 278.0 09/24/2015 1644   MCV 83.0 09/24/2015 1644   MCH 28.2 07/10/2013 1926   MCHC 33.2 09/24/2015 1644   RDW 13.9 09/24/2015 1644   LYMPHSABS 0.9 07/10/2013 1926   MONOABS 0.1 07/10/2013 1926   EOSABS 0.0 07/10/2013 1926   BASOSABS 0.0 07/10/2013 1926    Hgb A1C Lab Results  Component Value Date   HGBA1C 5.3 09/24/2015         Assessment & Plan:   Lipoma:  Reassurance given that this is benign Advised her to try Advil for pain  She is not interested in referral to a  general surgeon at this time Will continue to monitor, she will let me know if she changes her mind  RTC as needed or if symptoms persist or weight

## 2015-11-28 NOTE — Patient Instructions (Signed)

## 2016-12-12 ENCOUNTER — Ambulatory Visit (INDEPENDENT_AMBULATORY_CARE_PROVIDER_SITE_OTHER): Payer: BLUE CROSS/BLUE SHIELD | Admitting: Adult Health

## 2016-12-12 ENCOUNTER — Encounter: Payer: Self-pay | Admitting: Adult Health

## 2016-12-12 VITALS — BP 100/64 | Temp 98.1°F | Ht 59.5 in | Wt 95.6 lb

## 2016-12-12 DIAGNOSIS — L739 Follicular disorder, unspecified: Secondary | ICD-10-CM

## 2016-12-12 NOTE — Progress Notes (Signed)
Subjective:    Patient ID: Cynthia Klein, female    DOB: [birth year], 21 y.o.   MRN: [id number]  HPI 21 year old female who presents to the office today for an acute complaint of that of a one single "bump" on her skin hat has been present for two weeks, she first noticed this after shaving. She reports " it looked like a pimple at first." She denies any "bumps" around her vagina. The area in question is on the mons pubis.   Denies any drainage but has had some redness that has subsided. She is worried because it has been there for two weeks   Denies ay STD's in the past. Is sexually active with same partner of 7 months.   Denies any abdominal pain, vaginal discharge or odor.    Review of Systems  Constitutional: Negative.   Gastrointestinal: Negative.   Genitourinary: Negative.   Skin: Positive for color change.  All other systems reviewed and are negative.  Past Medical History:  Diagnosis Date  . Asthma   . Chicken pox   . Frequent headaches     Social History   Social History  . Marital status: Married    Spouse name: N/A  . Number of children: N/A  . Years of education: N/A   Occupational History  . Not on file.   Social History Main Topics  . Smoking status: Never Smoker  . Smokeless tobacco: Never Used  . Alcohol use 0.0 oz/week     Comment: occasional  . Drug use: No  . Sexual activity: Yes   Other Topics Concern  . Not on file   Social History Narrative   Patient lives at home with her parents.    Patient works at [employer].   Patient is single.    Patient has a high school education.    Patient has no children.     Past Surgical History:  Procedure Laterality Date  . WISDOM TOOTH EXTRACTION      Family History  Problem Relation Age of Onset  . Cancer Paternal Uncle     skin  . Diabetes Neg Hx   . Stroke Neg Hx   . Heart disease Neg Hx     Allergies  Allergen Reactions  . Penicillins Hives    Current Outpatient Prescriptions on  File Prior to Visit  Medication Sig Dispense Refill  . ibuprofen (ADVIL,MOTRIN) 200 MG tablet Take 200 mg by mouth as needed.     No current facility-administered medications on file prior to visit.     BP 100/64   Temp 98.1 F (36.7 C) (Oral)   Ht 4' 11.5" (1.511 m)   Wt 95 lb 9.6 oz (43.4 kg)   BMI 18.99 kg/m       Objective:   Physical Exam  Constitutional: She is oriented to person, place, and time. She appears well-developed and well-nourished. No distress.  Neurological: She is alert and oriented to person, place, and time.  Skin: Skin is warm and dry. No rash noted. She is not diaphoretic. No erythema. No pallor.  Well healing ingrown hair on mons pubis.. No signs of herpes or warts. No areas in question around the vagina  Psychiatric: She has a normal mood and affect. Her behavior is normal. Judgment and thought content normal.  Nursing note and vitals reviewed.     Assessment & Plan:  1. Folliculitis - Reassured patient about diagnosis.  - advised using sharp razors and moisturize after shaving  -  Follow up with PCP as needed  Dorothyann Peng, NP

## 2017-01-07 ENCOUNTER — Other Ambulatory Visit: Payer: Self-pay | Admitting: Obstetrics and Gynecology

## 2017-01-07 DIAGNOSIS — Z01419 Encounter for gynecological examination (general) (routine) without abnormal findings: Secondary | ICD-10-CM | POA: Diagnosis not present

## 2017-01-07 DIAGNOSIS — Z124 Encounter for screening for malignant neoplasm of cervix: Secondary | ICD-10-CM | POA: Diagnosis not present

## 2017-01-07 DIAGNOSIS — Z113 Encounter for screening for infections with a predominantly sexual mode of transmission: Secondary | ICD-10-CM | POA: Diagnosis not present

## 2017-01-07 DIAGNOSIS — Z681 Body mass index (BMI) 19 or less, adult: Secondary | ICD-10-CM | POA: Diagnosis not present

## 2017-01-08 LAB — CYTOLOGY - PAP

## 2017-08-21 IMAGING — US US RENAL
1 series · 14 of 25 positions shown · non-contrast
Comparison: None in PACs

CLINICAL DATA: Bilateral flank pain over the past month

EXAM:
RENAL / URINARY TRACT ULTRASOUND COMPLETE

[Series 1: us renal · 0.20mm/px · 14 of 65 slices shown]
[im 1/65]
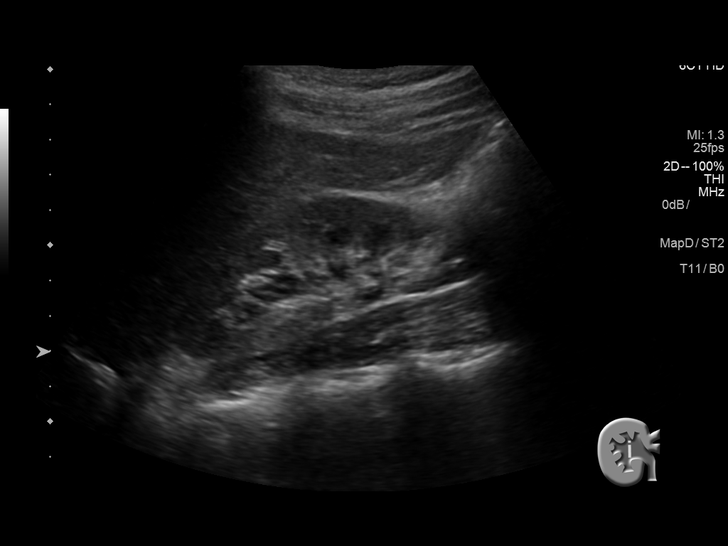
[im 6/65]
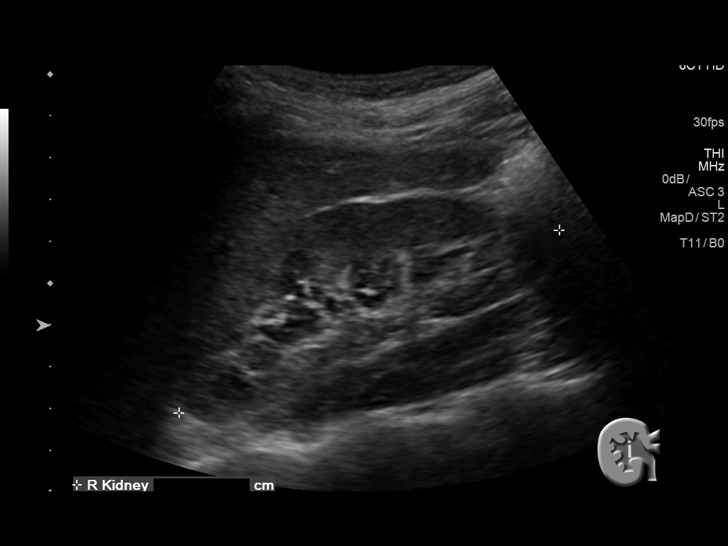
[im 11/65]
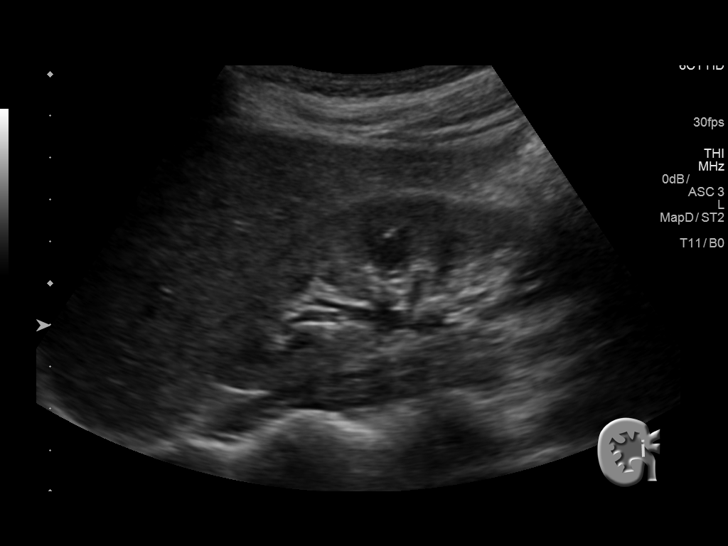
[im 17/65]
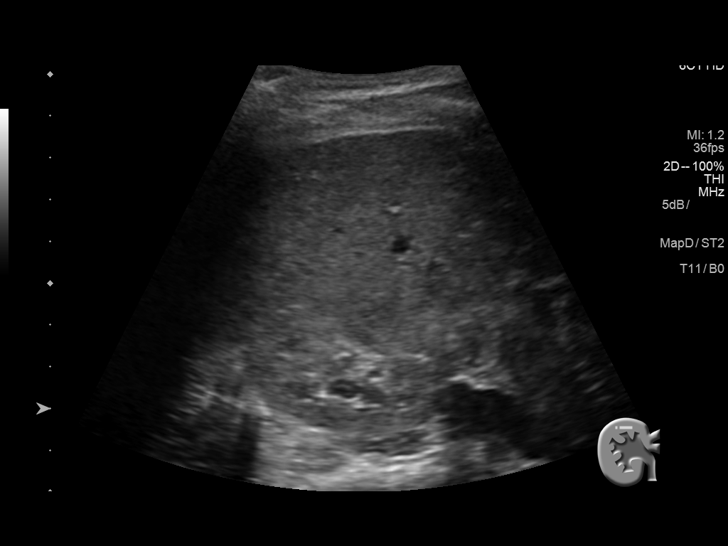
[im 22/65]
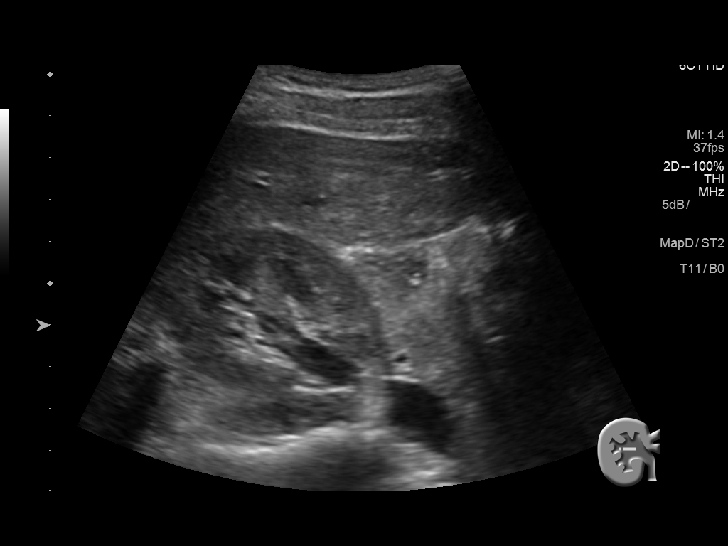
[im 25/65]
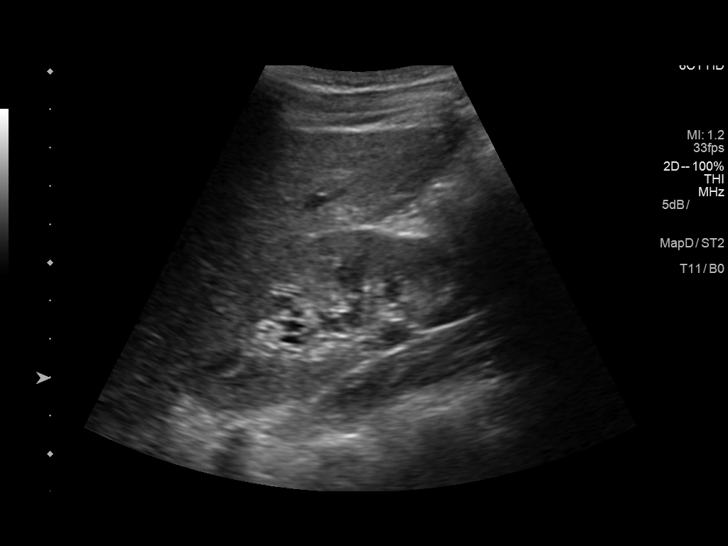
[im 30/65]
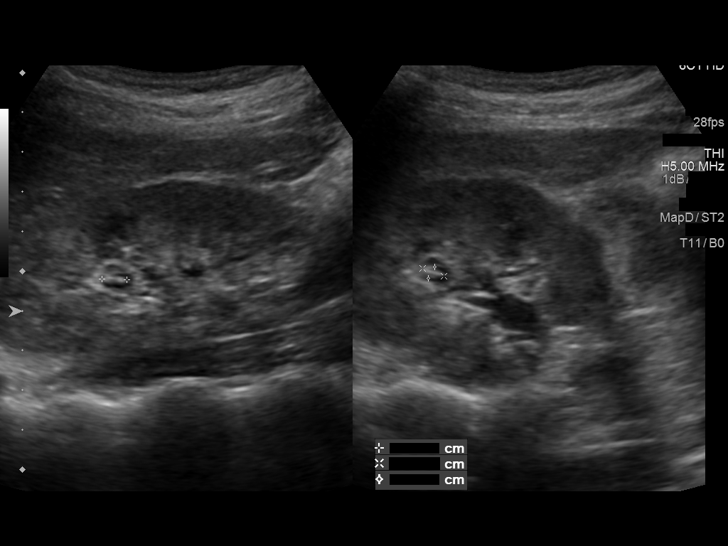
[im 35/65]
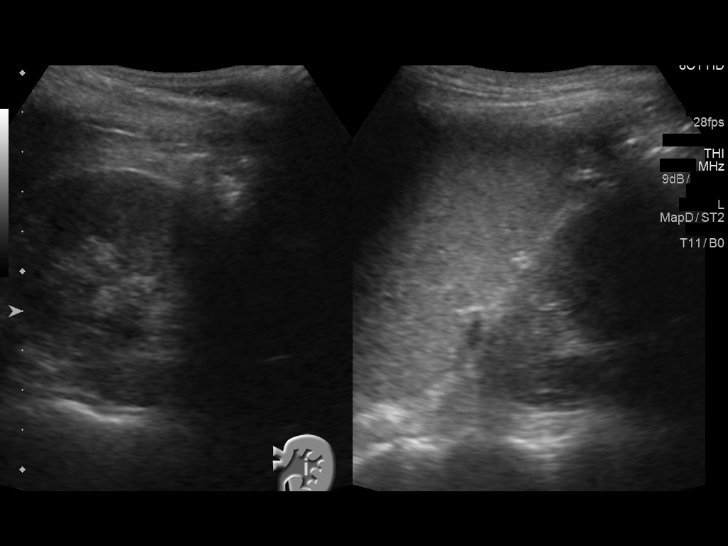
[im 41/65]
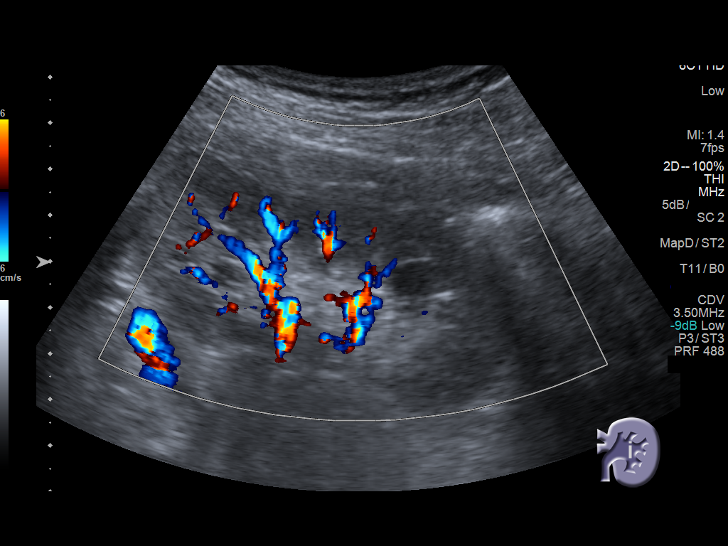
[im 43/65]
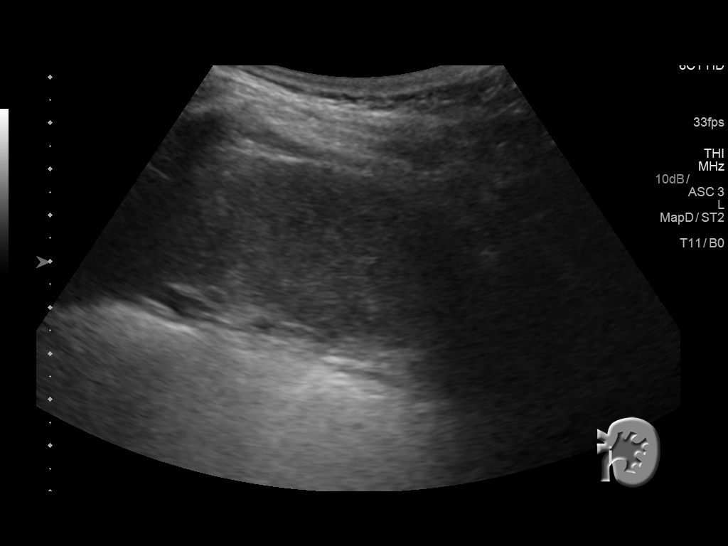
[im 49/65]
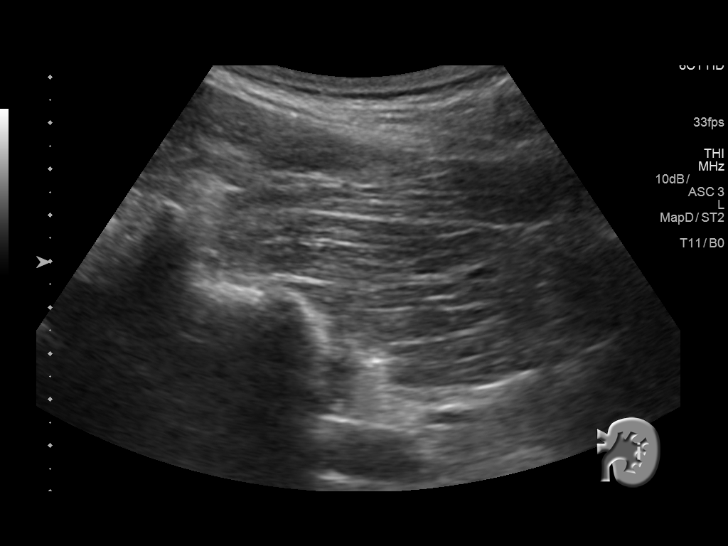
[im 54/65]
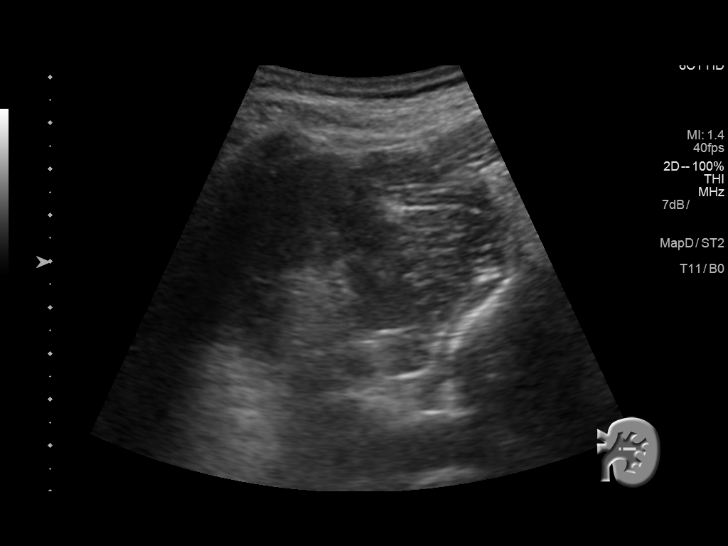
[im 59/65]
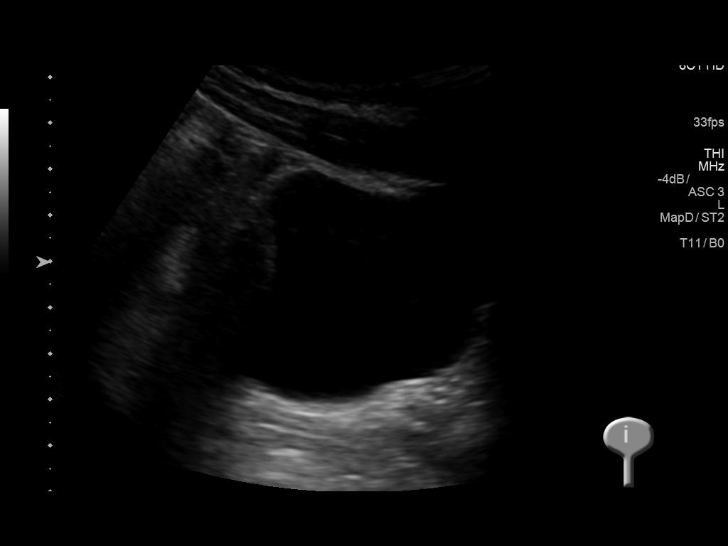
[im 65/65]
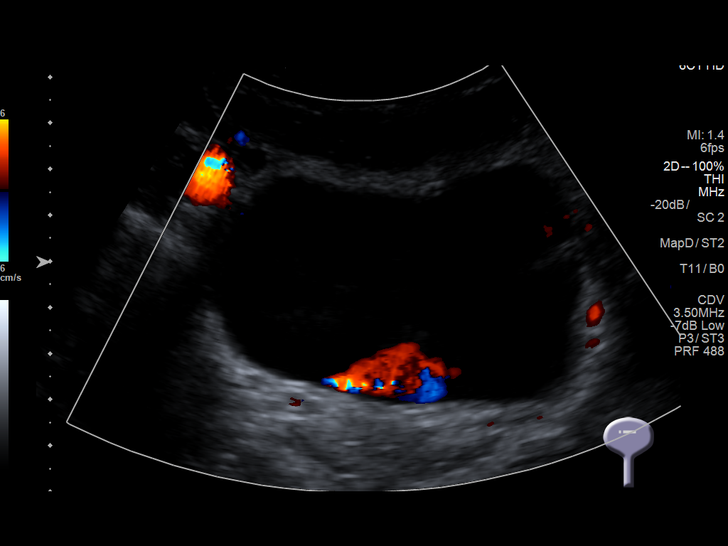

[14 of 25 positions shown; findings below may reference images not displayed]

FINDINGS: Right Kidney:

Length: 10.1 cm. The renal cortical echotexture remains slightly
lower than that of the adjacent liver. There is no hydronephrosis. A
6 mm non shadowing hyperechoic focus is noted in the midpole of the
right kidney which is nonspecific..

Left Kidney:

Length: 10 cm. Echogenicity within normal limits. No mass or
hydronephrosis visualized.

Bladder:

Bilateral ureteral jets are observed.
IMPRESSION: No definite acute abnormality of either kidney. There is no
hydronephrosis. If there is hematuria and stones are suspected,
further evaluation with CT urography would be useful.

## 2017-12-11 ENCOUNTER — Telehealth: Payer: Self-pay | Admitting: Internal Medicine

## 2017-12-11 NOTE — Telephone Encounter (Signed)
Copied from [place] [id number]. Topic: General - Other >> Dec 11, 2017  1:46 PM Lolita Rieger, [place] wrote: Reason for CRM: Pt called and stated that she needs her immunization forms faxed to [phone] [facility] student health Please contact pt once this has been done

## 2017-12-15 NOTE — Telephone Encounter (Signed)
°  Relation to pt: self Call back number:[phone]   Reason for call:  Patient checking on the status of immunization records faxed to [facility] [phone]. Patient states if not done as soon as possible she will have to come out of pocket for classes. Please note when faxed

## 2017-12-16 NOTE — Telephone Encounter (Signed)
Immunizations faxed as instructed below

## 2018-01-22 ENCOUNTER — Encounter: Payer: Self-pay | Admitting: Medical

## 2018-01-22 ENCOUNTER — Ambulatory Visit (INDEPENDENT_AMBULATORY_CARE_PROVIDER_SITE_OTHER): Payer: BLUE CROSS/BLUE SHIELD | Admitting: Medical

## 2018-01-22 VITALS — BP 110/60 | HR 71 | Ht 59.0 in | Wt 106.8 lb

## 2018-01-22 DIAGNOSIS — M549 Dorsalgia, unspecified: Secondary | ICD-10-CM | POA: Diagnosis not present

## 2018-01-22 DIAGNOSIS — D171 Benign lipomatous neoplasm of skin and subcutaneous tissue of trunk: Secondary | ICD-10-CM

## 2018-01-22 LAB — POCT URINALYSIS DIP (PROADVANTAGE DEVICE)
Bilirubin, UA: NEGATIVE
Blood, UA: NEGATIVE
Glucose, UA: NEGATIVE mg/dL
Ketones, POC UA: NEGATIVE mg/dL
Leukocytes, UA: NEGATIVE
NITRITE UA: NEGATIVE
PH UA: 6 (ref 5.0–8.0)
PROTEIN UA: NEGATIVE mg/dL
Specific Gravity, Urine: 1.01
UUROB: NEGATIVE

## 2018-01-22 LAB — POCT URINE PREGNANCY: PREG TEST UR: NEGATIVE

## 2018-01-22 NOTE — Progress Notes (Signed)
In laws  Cynthia Klein      Here for c/o back pain, awkes her in middle of the night, hurts to bend over.  If standing prolonged, when sitting, gets pain.   Been having pains for 2 weeks worse.  But hsa has pain for months.     Denies fall, trauma.     [name].    Exercise - nothing.    No urinary c/o.    Feels like she urinates often.   Drinks a lot of water and coffee.  Rare tea or soda.    No abdominal pain  No sports  Stretches some  Right handed  Sits with right hip suppported, carries trays, mid line pain.  BP 110/60   Pulse 71   Ht 4\' 11"  (1.499 m)   Wt 106 lb 12.8 oz (48.4 kg)   LMP 01/04/2018   SpO2 98%   BMI 21.57 kg/m

## 2018-01-23 DIAGNOSIS — D171 Benign lipomatous neoplasm of skin and subcutaneous tissue of trunk: Secondary | ICD-10-CM

## 2018-01-23 DIAGNOSIS — M549 Dorsalgia, unspecified: Secondary | ICD-10-CM | POA: Insufficient documentation

## 2018-01-23 DIAGNOSIS — D179 Benign lipomatous neoplasm, unspecified: Secondary | ICD-10-CM | POA: Insufficient documentation

## 2018-01-23 NOTE — Progress Notes (Signed)
Subjective:    Patient ID: Cynthia Klein, female    DOB: 1996-10-11, 22 y.o.   MRN: [id number]  HPI Here as a new patient today.  Referred by April Pait.  She is here for a complaint of back pain for the last month.  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  She works approximately 20 hours/week.  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  She denies injury, fall, trauma.  She denies fever, chills, body aches, urinary complaint, bowel complaint. She does tend to carry the weight of the trays on her left side, however she is right-handed. She is not taking any medication for pain. She denies history of urinary tract infection.  She is sexually active, no vaginal complaint, no concern for pregnancy or STD. She denies abdominal pain, nausea, vomiting, diarrhea, blood in the urine or stool  She does report a doctor one time told her that one shoulder blade seem to be a little higher than the other.  She also notes that when sitting in a chair she tends to put more support on the right side lifting the right side up.  No other aggravating or relieving factors.  Review of Systems   No significant health history noted      Objective:   Physical Exam  BP 110/60   Pulse 71   Ht 4\' 11"  (1.499 m)   Wt 106 lb 12.8 oz (48.4 kg)   LMP 01/04/2018   SpO2 98%   BMI 21.57 kg/m   General well-developed, well-nourished, no acute distress, lean white female No skin bruising or redness Abdomen with positive bowel sounds, soft, nontender, no mass, no organomegaly, no surgical scars No extremity edema Upper and lower extremity pulses within normal Legs nontender, normal range of motion, no deformity Back: there is a 3-4cm diameter lipoma of left upper back,  tenderness noted of midline T12-L2 region, no scoliosis, slight asymmetry of lower creases, no mass, pain noted with flexion, flexion range of motion 90% of normal, extension range of motion normal,  lateral flexion normal. Psych: pleasant, normal judgment, answers questions appropriately      Assessment & Plan:   Encounter Diagnoses  Name Primary?  . Bilateral back pain, unspecified back location, unspecified chronicity Yes  . Lipoma of torso    We discussed possible causes of your lower back pain.   The main findings today on your exam were slight asymmetry of your back creases.  This could suggests mild scoliosis or mild leg length discrepancy. I suspect the cause of your back pain is either musculoskeletal, possible due to need for back and core strengthening, the fact you carry the trays at work on one side causing some malalignment, or your could have some mild scoliosis or leg length discrepancy.   You have a lipoma or fatty tumor of your left upper back which is not worrisome.  There is no need for treatment at this time.  Specific recommendations to help your current back pain include: Rest your back for the next 5-7 days Avoid strenuous activity or heavy lifting for the next 5-7 days You may use heat to your back such as a hot shower, moist hot towel, or heat pad 2-3 times per day short-term Consider going to a massage therapist  You can use over the counter  Ibuprofen for pain relief.  You can use over the counter Ibuprofen, for pain and inflammation, 2 tablets up to 3  times per day for the next 3-5 days then as needed.  (Caution-I do not intend for you to use anti-inflammatories such as ibuprofen, Aleve, or Advil on a regular basis due to risk of gastric bleeding or damage to the liver or kidney)   General recommendations to help prevent back problems and to keep your back strong and healthy: I recommend you get 150 minutes of aerobic exercise per week such as walking, running, swimming, dancing, golf or other exercise. I recommend you do a general stretching routine daily. To prevent back issues, I recommend you use some core strengthening exercises as we discussed.   For example, do the following regimen at least 2 days per week:  perform 2-3 sets of 25 in each of abdominal crunches or sit ups  perform 2-3 sets of 25 in each of core twists  perform 2-3 sets of 25 in each of upright rows  perform 2-3 sets of 25 in each of dead lifts Drink plenty of water throughout the day so that your urine is clear If you smoke, I strongly recommend you quit smoking as this causes inflammation throughout the body including your back  Please note: If you experience worsening or significantly different symptoms in the near future then call or return immediately If you develop fever, urinary changes, bowel changes, body aches and chills, then get reevaluated immediately by either calling, returning, or going to the urgent care or emergency department after hours or weekend If you have severe back pain compared to today's visit, or numbness of the genitalia, incontinence of bowel or bladder, or develop the inability to walk or stand, then go to the emergency department immediately  Follow up:  If your pain continues call or return  Otherwise, I recommend a routine physical at your convenience if you have not had a physical within the past year

## 2018-01-23 NOTE — Patient Instructions (Signed)
Encounter Diagnoses  Name Primary?  . Bilateral back pain, unspecified back location, unspecified chronicity Yes  . Lipoma of torso     We discussed possible causes of your lower back pain.   The main findings today on your exam were slight asymmetry of your back creases.  This could suggests mild scoliosis or mild leg length discrepancy. I suspect the cause of your back pain is either musculoskeletal, possible due to need for back and core strengthening, the fact you carry the trays at work on one side causing some malalignment, or your could have some mild scoliosis or leg length discrepancy.   You have a lipoma or fatty tumor of your left upper back which is not worrisome.  There is no need for treatment at this time.  Specific recommendations to help your current back pain include: Rest your back for the next 5-7 days Avoid strenuous activity or heavy lifting for the next 5-7 days You may use heat to your back such as a hot shower, moist hot towel, or heat pad 2-3 times per day short-term Consider going to a massage therapist  You can use over the counter  Ibuprofen for pain relief.  You can use over the counter Ibuprofen, for pain and inflammation, 2 tablets up to 3 times per day for the next 3-5 days then as needed.  (Caution-I do not intend for you to use anti-inflammatories such as ibuprofen, Aleve, or Advil on a regular basis due to risk of gastric bleeding or damage to the liver or kidney)   General recommendations to help prevent back problems and to keep your back strong and healthy: I recommend you get 150 minutes of aerobic exercise per week such as walking, running, swimming, dancing, golf or other exercise. I recommend you do a general stretching routine daily. To prevent back issues, I recommend you use some core strengthening exercises as we discussed.  For example, do the following regimen at least 2 days per week:  perform 2-3 sets of 25 in each of abdominal crunches  or sit ups  perform 2-3 sets of 25 in each of core twists  perform 2-3 sets of 25 in each of upright rows  perform 2-3 sets of 25 in each of dead lifts Drink plenty of water throughout the day so that your urine is clear If you smoke, I strongly recommend you quit smoking as this causes inflammation throughout the body including your back  Please note: If you experience worsening or significantly different symptoms in the near future then call or return immediately If you develop fever, urinary changes, bowel changes, body aches and chills, then get reevaluated immediately by either calling, returning, or going to the urgent care or emergency department after hours or weekend If you have severe back pain compared to today's visit, or numbness of the genitalia, incontinence of bowel or bladder, or develop the inability to walk or stand, then go to the emergency department immediately  Follow up:  If your pain continues call or return  Otherwise, I recommend a routine physical at your convenience if you have not had a physical within the past year

## 2018-04-23 DIAGNOSIS — Z130 Encounter for screening for diseases of the blood and blood-forming organs and certain disorders involving the immune mechanism: Secondary | ICD-10-CM | POA: Diagnosis not present

## 2018-04-23 DIAGNOSIS — Z01419 Encounter for gynecological examination (general) (routine) without abnormal findings: Secondary | ICD-10-CM | POA: Diagnosis not present

## 2018-04-23 DIAGNOSIS — Z1151 Encounter for screening for human papillomavirus (HPV): Secondary | ICD-10-CM | POA: Diagnosis not present

## 2018-04-23 DIAGNOSIS — Z124 Encounter for screening for malignant neoplasm of cervix: Secondary | ICD-10-CM | POA: Diagnosis not present

## 2018-04-23 DIAGNOSIS — Z1389 Encounter for screening for other disorder: Secondary | ICD-10-CM | POA: Diagnosis not present

## 2018-04-23 DIAGNOSIS — Z6822 Body mass index (BMI) 22.0-22.9, adult: Secondary | ICD-10-CM | POA: Diagnosis not present

## 2018-04-23 DIAGNOSIS — Z113 Encounter for screening for infections with a predominantly sexual mode of transmission: Secondary | ICD-10-CM | POA: Diagnosis not present

## 2018-04-23 LAB — HM PAP SMEAR: HM Pap smear: NEGATIVE

## 2018-10-01 DIAGNOSIS — M9905 Segmental and somatic dysfunction of pelvic region: Secondary | ICD-10-CM | POA: Diagnosis not present

## 2018-10-01 DIAGNOSIS — M9901 Segmental and somatic dysfunction of cervical region: Secondary | ICD-10-CM | POA: Diagnosis not present

## 2018-10-01 DIAGNOSIS — M9903 Segmental and somatic dysfunction of lumbar region: Secondary | ICD-10-CM | POA: Diagnosis not present

## 2018-10-01 DIAGNOSIS — M9902 Segmental and somatic dysfunction of thoracic region: Secondary | ICD-10-CM | POA: Diagnosis not present

## 2018-10-05 DIAGNOSIS — M9901 Segmental and somatic dysfunction of cervical region: Secondary | ICD-10-CM | POA: Diagnosis not present

## 2018-10-05 DIAGNOSIS — M9902 Segmental and somatic dysfunction of thoracic region: Secondary | ICD-10-CM | POA: Diagnosis not present

## 2018-10-05 DIAGNOSIS — M9905 Segmental and somatic dysfunction of pelvic region: Secondary | ICD-10-CM | POA: Diagnosis not present

## 2018-10-05 DIAGNOSIS — M9903 Segmental and somatic dysfunction of lumbar region: Secondary | ICD-10-CM | POA: Diagnosis not present

## 2018-10-12 DIAGNOSIS — M9903 Segmental and somatic dysfunction of lumbar region: Secondary | ICD-10-CM | POA: Diagnosis not present

## 2018-10-12 DIAGNOSIS — M9905 Segmental and somatic dysfunction of pelvic region: Secondary | ICD-10-CM | POA: Diagnosis not present

## 2018-10-12 DIAGNOSIS — M9901 Segmental and somatic dysfunction of cervical region: Secondary | ICD-10-CM | POA: Diagnosis not present

## 2018-10-12 DIAGNOSIS — M9902 Segmental and somatic dysfunction of thoracic region: Secondary | ICD-10-CM | POA: Diagnosis not present

## 2018-10-15 DIAGNOSIS — M9905 Segmental and somatic dysfunction of pelvic region: Secondary | ICD-10-CM | POA: Diagnosis not present

## 2018-10-15 DIAGNOSIS — M545 Low back pain: Secondary | ICD-10-CM | POA: Diagnosis not present

## 2018-10-15 DIAGNOSIS — M9903 Segmental and somatic dysfunction of lumbar region: Secondary | ICD-10-CM | POA: Diagnosis not present

## 2018-10-15 DIAGNOSIS — M9902 Segmental and somatic dysfunction of thoracic region: Secondary | ICD-10-CM | POA: Diagnosis not present

## 2018-10-15 DIAGNOSIS — M9901 Segmental and somatic dysfunction of cervical region: Secondary | ICD-10-CM | POA: Diagnosis not present

## 2018-10-19 DIAGNOSIS — M9905 Segmental and somatic dysfunction of pelvic region: Secondary | ICD-10-CM | POA: Diagnosis not present

## 2018-10-19 DIAGNOSIS — M9903 Segmental and somatic dysfunction of lumbar region: Secondary | ICD-10-CM | POA: Diagnosis not present

## 2018-10-19 DIAGNOSIS — M9901 Segmental and somatic dysfunction of cervical region: Secondary | ICD-10-CM | POA: Diagnosis not present

## 2018-10-19 DIAGNOSIS — M9902 Segmental and somatic dysfunction of thoracic region: Secondary | ICD-10-CM | POA: Diagnosis not present

## 2018-10-22 DIAGNOSIS — M9901 Segmental and somatic dysfunction of cervical region: Secondary | ICD-10-CM | POA: Diagnosis not present

## 2018-10-22 DIAGNOSIS — M9903 Segmental and somatic dysfunction of lumbar region: Secondary | ICD-10-CM | POA: Diagnosis not present

## 2018-10-22 DIAGNOSIS — M9902 Segmental and somatic dysfunction of thoracic region: Secondary | ICD-10-CM | POA: Diagnosis not present

## 2018-10-22 DIAGNOSIS — M9905 Segmental and somatic dysfunction of pelvic region: Secondary | ICD-10-CM | POA: Diagnosis not present

## 2018-10-26 DIAGNOSIS — M9901 Segmental and somatic dysfunction of cervical region: Secondary | ICD-10-CM | POA: Diagnosis not present

## 2018-10-26 DIAGNOSIS — M9905 Segmental and somatic dysfunction of pelvic region: Secondary | ICD-10-CM | POA: Diagnosis not present

## 2018-10-26 DIAGNOSIS — M9902 Segmental and somatic dysfunction of thoracic region: Secondary | ICD-10-CM | POA: Diagnosis not present

## 2018-10-26 DIAGNOSIS — M9903 Segmental and somatic dysfunction of lumbar region: Secondary | ICD-10-CM | POA: Diagnosis not present

## 2018-11-01 DIAGNOSIS — S39012D Strain of muscle, fascia and tendon of lower back, subsequent encounter: Secondary | ICD-10-CM | POA: Diagnosis not present

## 2018-11-02 DIAGNOSIS — M9903 Segmental and somatic dysfunction of lumbar region: Secondary | ICD-10-CM | POA: Diagnosis not present

## 2018-11-02 DIAGNOSIS — M9902 Segmental and somatic dysfunction of thoracic region: Secondary | ICD-10-CM | POA: Diagnosis not present

## 2018-11-02 DIAGNOSIS — M9901 Segmental and somatic dysfunction of cervical region: Secondary | ICD-10-CM | POA: Diagnosis not present

## 2018-11-02 DIAGNOSIS — M9905 Segmental and somatic dysfunction of pelvic region: Secondary | ICD-10-CM | POA: Diagnosis not present

## 2018-11-05 DIAGNOSIS — M9903 Segmental and somatic dysfunction of lumbar region: Secondary | ICD-10-CM | POA: Diagnosis not present

## 2018-11-05 DIAGNOSIS — M9901 Segmental and somatic dysfunction of cervical region: Secondary | ICD-10-CM | POA: Diagnosis not present

## 2018-11-05 DIAGNOSIS — M9902 Segmental and somatic dysfunction of thoracic region: Secondary | ICD-10-CM | POA: Diagnosis not present

## 2018-11-05 DIAGNOSIS — M9905 Segmental and somatic dysfunction of pelvic region: Secondary | ICD-10-CM | POA: Diagnosis not present

## 2018-11-08 DIAGNOSIS — S39012D Strain of muscle, fascia and tendon of lower back, subsequent encounter: Secondary | ICD-10-CM | POA: Diagnosis not present

## 2018-11-11 DIAGNOSIS — M9905 Segmental and somatic dysfunction of pelvic region: Secondary | ICD-10-CM | POA: Diagnosis not present

## 2018-11-11 DIAGNOSIS — S39012D Strain of muscle, fascia and tendon of lower back, subsequent encounter: Secondary | ICD-10-CM | POA: Diagnosis not present

## 2018-11-11 DIAGNOSIS — M9902 Segmental and somatic dysfunction of thoracic region: Secondary | ICD-10-CM | POA: Diagnosis not present

## 2018-11-11 DIAGNOSIS — M9901 Segmental and somatic dysfunction of cervical region: Secondary | ICD-10-CM | POA: Diagnosis not present

## 2018-11-11 DIAGNOSIS — M9903 Segmental and somatic dysfunction of lumbar region: Secondary | ICD-10-CM | POA: Diagnosis not present

## 2018-11-15 DIAGNOSIS — M9905 Segmental and somatic dysfunction of pelvic region: Secondary | ICD-10-CM | POA: Diagnosis not present

## 2018-11-15 DIAGNOSIS — M9901 Segmental and somatic dysfunction of cervical region: Secondary | ICD-10-CM | POA: Diagnosis not present

## 2018-11-15 DIAGNOSIS — S39012D Strain of muscle, fascia and tendon of lower back, subsequent encounter: Secondary | ICD-10-CM | POA: Diagnosis not present

## 2018-11-15 DIAGNOSIS — M9902 Segmental and somatic dysfunction of thoracic region: Secondary | ICD-10-CM | POA: Diagnosis not present

## 2018-11-15 DIAGNOSIS — M9903 Segmental and somatic dysfunction of lumbar region: Secondary | ICD-10-CM | POA: Diagnosis not present

## 2018-11-18 DIAGNOSIS — S39012D Strain of muscle, fascia and tendon of lower back, subsequent encounter: Secondary | ICD-10-CM | POA: Diagnosis not present

## 2018-11-29 DIAGNOSIS — S39012D Strain of muscle, fascia and tendon of lower back, subsequent encounter: Secondary | ICD-10-CM | POA: Diagnosis not present

## 2019-07-11 DIAGNOSIS — Z1389 Encounter for screening for other disorder: Secondary | ICD-10-CM | POA: Diagnosis not present

## 2019-07-11 DIAGNOSIS — Z01419 Encounter for gynecological examination (general) (routine) without abnormal findings: Secondary | ICD-10-CM | POA: Diagnosis not present

## 2019-07-11 DIAGNOSIS — Z130 Encounter for screening for diseases of the blood and blood-forming organs and certain disorders involving the immune mechanism: Secondary | ICD-10-CM | POA: Diagnosis not present

## 2019-09-05 ENCOUNTER — Ambulatory Visit (INDEPENDENT_AMBULATORY_CARE_PROVIDER_SITE_OTHER): Payer: BC Managed Care – PPO | Admitting: Medical

## 2019-09-05 ENCOUNTER — Encounter: Payer: Self-pay | Admitting: Medical

## 2019-09-05 ENCOUNTER — Other Ambulatory Visit: Payer: Self-pay

## 2019-09-05 VITALS — BP 102/60 | HR 79 | Temp 97.8°F | Wt 112.2 lb

## 2019-09-05 DIAGNOSIS — R21 Rash and other nonspecific skin eruption: Secondary | ICD-10-CM

## 2019-09-05 DIAGNOSIS — Z3202 Encounter for pregnancy test, result negative: Secondary | ICD-10-CM | POA: Diagnosis not present

## 2019-09-05 DIAGNOSIS — L237 Allergic contact dermatitis due to plants, except food: Secondary | ICD-10-CM | POA: Diagnosis not present

## 2019-09-05 LAB — POCT URINE PREGNANCY: Preg Test, Ur: NEGATIVE

## 2019-09-05 MED ORDER — PREDNISONE 10 MG PO TABS: ORAL_TABLET | ORAL | 0 refills | Status: DC

## 2019-09-05 MED ORDER — TRIAMCINOLONE ACETONIDE 0.1 % EX CREA: 1.0000 "application " | TOPICAL_CREAM | Freq: Two times a day (BID) | CUTANEOUS | 0 refills | Status: DC

## 2019-09-05 NOTE — Progress Notes (Signed)
Subjective: Chief Complaint  Patient presents with  . Rash    2 weeks-started on left ankle    Here for rash.  Has rash that started on back of left foot/ankle 2.5 weeks ago, but this past weekend started spreading. Now has rash on underneath of chin and neck, right hand, abdomen, right ankle, right thigh.   Rash is very itchy.   Thinks its poison ivy.  Was weeding the yard with spouse before rash started, was wearing long pants and shirt, but no socks, wearing sandals, the ones she has on today.    No prior poison ivy rash.    No other new exposures.  otherwise in normal state of health.   LMP was 08/14/2019  Is actively trying to get pregnant.    Past Medical History:  Diagnosis Date  . Asthma   . Chicken pox   . Frequent headaches    Current Outpatient Medications on File Prior to Visit  Medication Sig Dispense Refill  . ibuprofen (ADVIL,MOTRIN) 200 MG tablet Take 200 mg by mouth as needed.     No current facility-administered medications on file prior to visit.    ROS as in subjective   Objective: BP 102/60   Pulse 79   Temp 97.8 F (36.6 C)   Wt 112 lb 3.2 oz (50.9 kg)   SpO2 98%   BMI 22.66 kg/m   Gen: wd, wn, nad Skin: left posterior foot and over achilles with linear urticarial rash, and inferior to this is a patch of pink/yellow raised urticaria, similar small round patch of urticarial rash on right index finger dorsal, right dorsal hand, several similar patches of mild whealed rash on abdomen, and similar mild urticarial rash on neck anteriorly and under chin    Assessment: Encounter Diagnoses  Name Primary?  . Poison ivy dermatitis Yes  . Rash   . Pregnancy examination or test, negative result      Plan: Discussed rash suggestive of poison plant dermatitis.  Begin oral Benadryl over-the-counter once to twice daily the next 3 to 4 days, can begin prednisone oral taper as below, discussed risk and benefits of medication, proper use of medication, begin  triamcinolone cream to the affected areas except for the face, and if not resolved within the next 3 to 4 days let me know.  Discussed signs of secondary infection to call back about.    Urine pregnancy negative.  Test was done since she is actively trying to have a baby and want to make sure the medication would not be risky to her.   Kajuana was seen today for rash.  Diagnoses and all orders for this visit:  Poison ivy dermatitis  Rash  Pregnancy examination or test, negative result -     POCT urine pregnancy  Other orders -     triamcinolone cream (KENALOG) 0.1 %; Apply 1 application topically 2 (two) times daily. -     predniSONE (DELTASONE) 10 MG tablet; 6 tablets all together day 1, 5 tablets day 2, 4 tablets day 3, 3 tablets day 4, 2 tablets day 5, 1 tablet day 6.

## 2019-10-04 ENCOUNTER — Other Ambulatory Visit: Payer: Self-pay

## 2019-10-04 ENCOUNTER — Ambulatory Visit (INDEPENDENT_AMBULATORY_CARE_PROVIDER_SITE_OTHER): Payer: BC Managed Care – PPO | Admitting: Medical

## 2019-10-04 ENCOUNTER — Encounter: Payer: Self-pay | Admitting: Medical

## 2019-10-04 VITALS — BP 100/68 | HR 87 | Temp 97.8°F | Ht 59.0 in | Wt 111.6 lb

## 2019-10-04 DIAGNOSIS — R8290 Unspecified abnormal findings in urine: Secondary | ICD-10-CM

## 2019-10-04 DIAGNOSIS — D179 Benign lipomatous neoplasm, unspecified: Secondary | ICD-10-CM

## 2019-10-04 DIAGNOSIS — Z0000 Encounter for general adult medical examination without abnormal findings: Secondary | ICD-10-CM

## 2019-10-04 DIAGNOSIS — Z23 Encounter for immunization: Secondary | ICD-10-CM | POA: Diagnosis not present

## 2019-10-04 DIAGNOSIS — R519 Headache, unspecified: Secondary | ICD-10-CM | POA: Diagnosis not present

## 2019-10-04 DIAGNOSIS — Z8669 Personal history of other diseases of the nervous system and sense organs: Secondary | ICD-10-CM

## 2019-10-04 DIAGNOSIS — L989 Disorder of the skin and subcutaneous tissue, unspecified: Secondary | ICD-10-CM

## 2019-10-04 NOTE — Progress Notes (Signed)
Subjective:   HPI  Cynthia Klein is a 23 y.o. female who presents for Chief Complaint  Patient presents with  . Annual Exam  . Urinary Frequency    Patient Care Team: Tysinger, Leward Quan as PCP - General (Family Medicine) Sees dentist Sees eye doctor [facility], Dr. Jake Shark   Concerns: She has skin lesion on her right upper thigh she wants looked at.  No recent changes, been there for over a year but it hurts to touch.  She would like this cut off.  She does not wear corrective lenses but does use glasses with UV filters in them  She is nonfasting today  She has a history of chronic headaches since high school, prior neurology consult, prior history of migraines and tension headaches.  Prior head scan several years ago.  No specific trigger although she can have worse mood with periods with no history of depression.  Does not always sleep well.  In the past birth control and prenatal vitamins have made headaches worse.  History of lipoma left upper back unchanged for years.  Reviewed their medical, surgical, family, social, medication, and allergy history and updated chart as appropriate.   Past Medical History:  Diagnosis Date  . Asthma   . Chicken pox    ?  Marland Kitchen Frequent headaches     Past Surgical History:  Procedure Laterality Date  . WISDOM TOOTH EXTRACTION      Social History   Socioeconomic History  . Marital status: Married    Spouse name: Not on file  . Number of children: Not on file  . Years of education: Not on file  . Highest education level: Not on file  Occupational History  . Not on file  Social Needs  . Financial resource strain: Not on file  . Food insecurity    Worry: Not on file    Inability: Not on file  . Transportation needs    Medical: Not on file    Non-medical: Not on file  Tobacco Use  . Smoking status: Never Smoker  . Smokeless tobacco: Never Used  Substance and Sexual Activity  . Alcohol use: Yes     Alcohol/week: 0.0 standard drinks    Comment: occasional  . Drug use: No  . Sexual activity: Yes  Lifestyle  . Physical activity    Days per week: Not on file    Minutes per session: Not on file  . Stress: Not on file  Relationships  . Social Herbalist on phone: Not on file    Gets together: Not on file    Attends religious service: Not on file    Active member of club or organization: Not on file    Attends meetings of clubs or organizations: Not on file    Relationship status: Not on file  . Intimate partner violence    Fear of current or ex partner: Not on file    Emotionally abused: Not on file    Physically abused: Not on file    Forced sexual activity: Not on file  Other Topics Concern  . Not on file  Social History Narrative   Lives with husband Scarlette Slice.  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  Not exercising.  In school at [employer], senior, hybrid online and in person, business admin, concentration in Mudlogger.   10/2019    Family History  Problem Relation Age of Onset  . Depression Mother   .  Bipolar disorder Mother   . Cancer Paternal Uncle        skin  . Depression Sister   . Bipolar disorder Sister   . Diabetes Maternal Uncle   . Diabetes Maternal Grandfather   . Diabetes Sister        type 1  . Depression Sister   . Thyroid disease Sister   . Dementia Paternal Grandmother   . Stroke Neg Hx   . Heart disease Neg Hx      Current Outpatient Medications:  .  ibuprofen (ADVIL,MOTRIN) 200 MG tablet, Take 200 mg by mouth as needed., Disp: , Rfl:  .  predniSONE (DELTASONE) 10 MG tablet, 6 tablets all together day 1, 5 tablets day 2, 4 tablets day 3, 3 tablets day 4, 2 tablets day 5, 1 tablet day 6. (Patient not taking: Reported on 10/04/2019), Disp: 21 tablet, Rfl: 0 .  triamcinolone cream (KENALOG) 0.1 %, Apply 1 application topically 2 (two) times daily. (Patient not taking: Reported on 10/04/2019), Disp: 30 g, Rfl: 0  Allergies  Allergen  Reactions  . Penicillins Hives     Review of Systems Constitutional: -fever, -chills, -sweats, -unexpected weight change, -decreased appetite, -fatigue Allergy: -sneezing, -itching, -congestion Dermatology: -changing moles, --rash, -lumps ENT: -runny nose, -ear pain, -sore throat, -hoarseness, -sinus pain, -teeth pain, - ringing in ears, -hearing loss, -nosebleeds Cardiology: -chest pain, -palpitations, -swelling, -difficulty breathing when lying flat, -waking up short of breath Respiratory: -cough, -shortness of breath, -difficulty breathing with exercise or exertion, -wheezing, -coughing up blood Gastroenterology: -abdominal pain, -nausea, -vomiting, -diarrhea, -constipation, -blood in stool, -changes in bowel movement, -difficulty swallowing or eating Hematology: -bleeding, -bruising  Musculoskeletal: -joint aches, -muscle aches, -joint swelling, -back pain, -neck pain, -cramping, -changes in gait Ophthalmology: denies vision changes, eye redness, itching, discharge Urology: -burning with urination, -difficulty urinating, -blood in urine, -urinary frequency, +urgency, -incontinence Neurology: +headache, -weakness, -tingling, -numbness, -memory loss, -falls, -dizziness Psychology: -depressed mood, -agitation, -sleep problems Breast/gyn: -breast tenderness, -discharge, -lumps, -vaginal discharge,- irregular periods, -heavy periods     Objective:  BP 100/68   Pulse 87   Temp 97.8 F (36.6 C)   Ht 4\' 11"  (1.499 m)   Wt 111 lb 9.6 oz (50.6 kg)   SpO2 99%   BMI 22.54 kg/m   General appearance: alert, no distress, WD/WN, Caucasian female Skin: Left upper back with subcutaneous 4cm diameter mass suggestive of lipoma unchanged from prior exam, right inner upper thigh with 4 mm x 3 mm diameter salmon-colored somewhat tender somewhat oval-shaped lesion, freckles throughout arms, no other worrisome lesions, exam chaperoned by nurse HEENT: normocephalic, conjunctiva/corneas normal, sclerae  anicteric, PERRLA, EOMi, nares patent, no discharge or erythema, pharynx normal Oral cavity: MMM, tongue normal, teeth normal Neck: supple, no lymphadenopathy, no thyromegaly, no masses, normal ROM, no bruits Chest: non tender, normal shape and expansion Heart: RRR, normal S1, S2, no murmurs Lungs: CTA bilaterally, no wheezes, rhonchi, or rales Abdomen: +bs, soft, non tender, non distended, no masses, no hepatomegaly, no splenomegaly, no bruits Back: non tender, normal ROM, no scoliosis Musculoskeletal: upper extremities non tender, no obvious deformity, normal ROM throughout, lower extremities non tender, no obvious deformity, normal ROM throughout Extremities: no edema, no cyanosis, no clubbing Pulses: 2+ symmetric, upper and lower extremities, normal cap refill Neurological: alert, oriented x 3, CN2-12 intact, strength normal upper extremities and lower extremities, sensation normal throughout, DTRs 2+ throughout, no cerebellar signs, gait normal Psychiatric: normal affect, behavior normal, pleasant  breast/gyn/rectal - deferred to  gynecology   Assessment and Plan :   Encounter Diagnoses  Name Primary?  . Encounter for health maintenance examination in adult Yes  . Need for influenza vaccination   . Need for Tdap vaccination   . Nonintractable headache, unspecified chronicity pattern, unspecified headache type   . Lipoma, unspecified site   . Abnormal urinalysis   . History of migraine   . Skin lesion     Physical exam - discussed and counseled on healthy lifestyle, diet, exercise, preventative care, vaccinations, sick and well care, proper use of emergency dept and after hours care, and addressed their concerns.    Health screening: Advised they see their eye doctor yearly for routine vision care. Advised they see their dentist yearly for routine dental care including hygiene visits twice yearly. See your gynecologist yearly for routine gynecological care.  Cancer  screening Counseled on self breast exams,  cervical cancer screening   Vaccinations: Advised yearly influenza vaccine Counseled on the influenza virus vaccine.  Vaccine information sheet given.  Influenza vaccine given after consent obtained.  Counseled on the Tdap (tetanus, diptheria, and acellular pertussis) vaccine.  Vaccine information sheet given. Tdap vaccine given after consent obtained.  Separate significant issues discussed: Headaches-we discussed strategies to limit triggers for headaches.  She has history of migraines and tension headaches going back to high school.  I reviewed prior head scan on chart record.  Limit caffeine, counseled on getting quality sleep, hydration.  Follow-up pending labs  Lipoma -reassured, will leave alone for now  Skin lesion-she will plan to return for excision with likely punch biopsy  Abnormal urinalysis - urine culture sent   Keera was seen today for annual exam and urinary frequency.  Diagnoses and all orders for this visit:  Encounter for health maintenance examination in adult -     Comprehensive metabolic panel -     CBC with Differential/Platelet -     TSH -     Magnesium -     Urine Culture  Need for influenza vaccination -     Flu Vaccine QUAD 6+ mos PF IM (Fluarix Quad PF)  Need for Tdap vaccination -     Tdap vaccine greater than or equal to 7yo IM  Nonintractable headache, unspecified chronicity pattern, unspecified headache type  Lipoma, unspecified site  Abnormal urinalysis -     Urine Culture  History of migraine  Skin lesion    Follow-up pending labs, yearly for physical

## 2019-10-05 LAB — POCT URINALYSIS DIP (PROADVANTAGE DEVICE)
Bilirubin, UA: NEGATIVE
Glucose, UA: NEGATIVE mg/dL
Leukocytes, UA: NEGATIVE
Nitrite, UA: NEGATIVE
Specific Gravity, Urine: 1.03
Urobilinogen, Ur: NEGATIVE
pH, UA: 6 (ref 5.0–8.0)

## 2019-10-05 LAB — COMPREHENSIVE METABOLIC PANEL
ALT: 12 IU/L (ref 0–32)
AST: 16 IU/L (ref 0–40)
Albumin/Globulin Ratio: 2.1 (ref 1.2–2.2)
Albumin: 4.5 g/dL (ref 3.9–5.0)
Alkaline Phosphatase: 74 IU/L (ref 39–117)
BUN/Creatinine Ratio: 9 (ref 9–23)
BUN: 8 mg/dL (ref 6–20)
Bilirubin Total: <0.2 mg/dL (ref 0.0–1.2)
CO2: 23 mmol/L (ref 20–29)
Calcium: 9.8 mg/dL (ref 8.7–10.2)
Chloride: 104 mmol/L (ref 96–106)
Creatinine, Ser: 0.92 mg/dL (ref 0.57–1.00)
GFR calc Af Amer: 100 mL/min/{1.73_m2} (ref >59)
GFR calc non Af Amer: 87 mL/min/{1.73_m2} (ref >59)
Globulin, Total: 2.1 g/dL (ref 1.5–4.5)
Glucose: 84 mg/dL (ref 65–99)
Potassium: 4.5 mmol/L (ref 3.5–5.2)
Sodium: 140 mmol/L (ref 134–144)
Total Protein: 6.6 g/dL (ref 6.0–8.5)

## 2019-10-05 LAB — CBC WITH DIFFERENTIAL/PLATELET
Basophils Absolute: 0.1 10*3/uL (ref 0.0–0.2)
Basos: 1 %
EOS (ABSOLUTE): 0.2 10*3/uL (ref 0.0–0.4)
Eos: 3 %
Hematocrit: 38.9 % (ref 34.0–46.6)
Hemoglobin: 12.9 g/dL (ref 11.1–15.9)
Immature Grans (Abs): 0 10*3/uL (ref 0.0–0.1)
Immature Granulocytes: 0 %
Lymphocytes Absolute: 2.3 10*3/uL (ref 0.7–3.1)
Lymphs: 27 %
MCH: 27.4 pg (ref 26.6–33.0)
MCHC: 33.2 g/dL (ref 31.5–35.7)
MCV: 83 fL (ref 79–97)
Monocytes Absolute: 0.5 10*3/uL (ref 0.1–0.9)
Monocytes: 6 %
Neutrophils Absolute: 5.3 10*3/uL (ref 1.4–7.0)
Neutrophils: 63 %
Platelets: 345 10*3/uL (ref 150–450)
RBC: 4.7 x10E6/uL (ref 3.77–5.28)
RDW: 12.5 % (ref 11.7–15.4)
WBC: 8.4 10*3/uL (ref 3.4–10.8)

## 2019-10-05 LAB — MAGNESIUM: Magnesium: 2.1 mg/dL (ref 1.6–2.3)

## 2019-10-05 LAB — TSH: TSH: 1.9 u[IU]/mL (ref 0.450–4.500)

## 2019-10-05 NOTE — Addendum Note (Signed)
Addended by: Edgar Frisk on: 10/05/2019 10:43 AM   Modules accepted: Orders

## 2019-10-06 LAB — URINE CULTURE

## 2020-03-06 ENCOUNTER — Telehealth: Payer: Self-pay | Admitting: Medical

## 2020-03-06 NOTE — Telephone Encounter (Signed)
Pt is not due until nov pt already has one setup for November

## 2020-03-06 NOTE — Telephone Encounter (Signed)
Please call patient to set up yearly physical.   This is a friendly reminder to have them come in for annual wellness exam/physical. Thanks Shane 

## 2020-03-07 NOTE — Telephone Encounter (Signed)
Ok, I must have mis looked at the chart record

## 2020-04-03 ENCOUNTER — Encounter: Payer: Self-pay | Admitting: Medical

## 2020-04-03 ENCOUNTER — Telehealth (INDEPENDENT_AMBULATORY_CARE_PROVIDER_SITE_OTHER): Payer: BC Managed Care – PPO | Admitting: Medical

## 2020-04-03 VITALS — Ht 59.0 in | Wt 115.0 lb

## 2020-04-03 DIAGNOSIS — L237 Allergic contact dermatitis due to plants, except food: Secondary | ICD-10-CM | POA: Diagnosis not present

## 2020-04-03 DIAGNOSIS — R21 Rash and other nonspecific skin eruption: Secondary | ICD-10-CM

## 2020-04-03 MED ORDER — PREDNISONE 10 MG PO TABS: ORAL_TABLET | ORAL | 0 refills | Status: DC

## 2020-04-03 MED ORDER — TRIAMCINOLONE ACETONIDE 0.1 % EX CREA: 1.0000 "application " | TOPICAL_CREAM | Freq: Two times a day (BID) | CUTANEOUS | 0 refills | Status: DC

## 2020-04-03 NOTE — Progress Notes (Signed)
  Subjective:   Cynthia Klein is a 24 y.o. female who presents for evaluation of a rash involving the right chin, right neck, right abdomen, both shoulders, left thigh.  Concerned for poison ivy as they have recent exposure - working out in the yard pulling weeds and gardening this past weekend.   Rash started 3 days ago.  Rash is itchy, red, raised.  Patient denies: fever, joint aches, chills, nausea. Patient has not had contacts with similar rash.   Using calamine lotion for the rash.  No other aggravating or relieving factors.  No other c/o.   The following portions of the patient's history were reviewed and updated as appropriate: allergies, current medications, past family history, past medical history, past social history and problem list.  Review of Systems As in subjective above   Objective:   Gen: wd, wn, nad Skin:  Scattered pink/red, round urticarial lesions involving right chin and neck, large patch on right abdomen, and smaller patches on both shoulders.   Assessment:   Encounter Diagnoses  Name Primary?  . Poison ivy dermatitis Yes  . Rash       Plan:   Discussed symptoms and exam findings, diagnosis, treatment options.  Etiology appears to be poison ivy dermatitis.  Prescribed: triamincinolone steroid cream to apply topically daily for the next several days until this resolves.  Begin dose of prednisone oral as below.  Discussed risks/benefits of medicaiton.  Discussed washing contaminated clothing on the hot cycle in the washing machine, washing gloves, shoes, or other utensils in hot soapy water.  Avoid re- exposure.  Discussed signs of infection or worsening symptoms that would prompt recheck.   Advised OTC Benadryl 1/2-1 tablet po up to TID.    [name] was seen today for poison ivy.  Diagnoses and all orders for this visit:  Poison ivy dermatitis  Rash  Other orders -     predniSONE (DELTASONE) 10 MG tablet; 6 tablets day 1, 5 tablets day 2, 4 tablets day  3, 3 tablets day 4, 2 tablets day 5, 1 tablet day 6 -     triamcinolone cream (KENALOG) 0.1 %; Apply 1 application topically 2 (two) times daily.    Follow up prn, or if not much improved in the next 4-5 days.,

## 2020-04-17 ENCOUNTER — Other Ambulatory Visit: Payer: Self-pay

## 2020-04-17 ENCOUNTER — Ambulatory Visit (INDEPENDENT_AMBULATORY_CARE_PROVIDER_SITE_OTHER): Payer: BC Managed Care – PPO | Admitting: Family Medicine

## 2020-04-17 ENCOUNTER — Encounter: Payer: Self-pay | Admitting: Family Medicine

## 2020-04-17 VITALS — BP 112/68 | HR 90 | Temp 98.2°F | Wt 115.4 lb

## 2020-04-17 DIAGNOSIS — L309 Dermatitis, unspecified: Secondary | ICD-10-CM | POA: Diagnosis not present

## 2020-04-17 NOTE — Progress Notes (Signed)
   Subjective:    Patient ID: Cynthia Klein, female    DOB: Aug 31, 1996, 24 y.o.   MRN: [id number]  HPI She complains of a several day history of a rash with some pruritus in the thigh and lower abdominal area.  She is exposed to scabies and has concerned over that.   Review of Systems     Objective:   Physical Exam Alert and in no distress.  Exam of the right inner thigh and left flank does show slightly erythematous fine type rash with no distinct borders.  The area is roughly 5 to 6 cm in size.      Assessment & Plan:  Dermatitis The rash is very nonsustained and does not have any components of infection.  Is very nonspecific.  I will recommend that she treat this with cortisone cream, Allegra/Claritin and can also use Tagamet.  I explained this all to her in detail.  If the rash gets worse she will call for possible referral to dermatology.

## 2020-04-17 NOTE — Patient Instructions (Signed)
Use cortisone cream sparingly several times per day.  You can also use either Claritin or Allegra which should help with the itching and also believe or not Zantac

## 2020-07-24 DIAGNOSIS — Z01419 Encounter for gynecological examination (general) (routine) without abnormal findings: Secondary | ICD-10-CM | POA: Diagnosis not present

## 2020-07-24 DIAGNOSIS — Z1389 Encounter for screening for other disorder: Secondary | ICD-10-CM | POA: Diagnosis not present

## 2020-07-24 DIAGNOSIS — Z6824 Body mass index (BMI) 24.0-24.9, adult: Secondary | ICD-10-CM | POA: Diagnosis not present

## 2020-07-24 DIAGNOSIS — Z130 Encounter for screening for diseases of the blood and blood-forming organs and certain disorders involving the immune mechanism: Secondary | ICD-10-CM | POA: Diagnosis not present

## 2020-10-04 ENCOUNTER — Encounter: Payer: BC Managed Care – PPO | Admitting: Medical

## 2020-11-20 ENCOUNTER — Encounter: Payer: BC Managed Care – PPO | Admitting: Medical

## 2021-05-03 ENCOUNTER — Encounter: Payer: Self-pay | Admitting: Medical

## 2022-08-06 ENCOUNTER — Encounter: Payer: Self-pay | Admitting: Internal Medicine

## 2022-08-25 LAB — OB RESULTS CONSOLE RUBELLA ANTIBODY, IGM: Rubella: IMMUNE

## 2022-08-25 LAB — OB RESULTS CONSOLE HEPATITIS B SURFACE ANTIGEN: Hepatitis B Surface Ag: NEGATIVE

## 2022-08-25 LAB — OB RESULTS CONSOLE GC/CHLAMYDIA
Chlamydia: NEGATIVE
Neisseria Gonorrhea: NEGATIVE

## 2022-09-04 ENCOUNTER — Encounter: Payer: Self-pay | Admitting: Internal Medicine

## 2022-09-09 ENCOUNTER — Encounter: Payer: Self-pay | Admitting: Internal Medicine

## 2022-09-17 ENCOUNTER — Inpatient Hospital Stay (HOSPITAL_COMMUNITY)
Admission: AD | Admit: 2022-09-17 | Discharge: 2022-09-17 | Disposition: A | Discharge status: Home / Self Care | Payer: BC Managed Care – PPO | Attending: Obstetrics and Gynecology | Admitting: Obstetrics and Gynecology

## 2022-09-17 ENCOUNTER — Encounter (HOSPITAL_COMMUNITY): Payer: Self-pay | Admitting: *Deleted

## 2022-09-17 DIAGNOSIS — Z3A12 12 weeks gestation of pregnancy: Secondary | ICD-10-CM | POA: Diagnosis not present

## 2022-09-17 DIAGNOSIS — O219 Vomiting of pregnancy, unspecified: Secondary | ICD-10-CM | POA: Diagnosis present

## 2022-09-17 DIAGNOSIS — O99511 Diseases of the respiratory system complicating pregnancy, first trimester: Secondary | ICD-10-CM | POA: Insufficient documentation

## 2022-09-17 LAB — URINALYSIS, ROUTINE W REFLEX MICROSCOPIC
Bilirubin Urine: NEGATIVE
Glucose, UA: NEGATIVE mg/dL
Hgb urine dipstick: NEGATIVE
Ketones, ur: 80 mg/dL — AB
Nitrite: NEGATIVE
Protein, ur: 30 mg/dL — AB
Specific Gravity, Urine: 1.027 (ref 1.005–1.030)
pH: 6 (ref 5.0–8.0)

## 2022-09-17 MED ORDER — METOCLOPRAMIDE HCL 10 MG PO TABS: 10.0000 mg | ORAL_TABLET | Freq: Three times a day (TID) | ORAL | 0 refills | Status: DC | PRN

## 2022-09-17 MED ORDER — LACTATED RINGERS IV BOLUS
1000.0000 mL | Freq: Once | INTRAVENOUS | Status: AC
  Administered 2022-09-17: 1000 mL via INTRAVENOUS

## 2022-09-17 MED ORDER — METOCLOPRAMIDE HCL 5 MG/ML IJ SOLN
10.0000 mg | Freq: Once | INTRAMUSCULAR | Status: AC
  Administered 2022-09-17: 10 mg via INTRAVENOUS
  Filled 2022-09-17: qty 2

## 2022-09-17 MED ORDER — ONDANSETRON 4 MG PO TBDP: 4.0000 mg | ORAL_TABLET | Freq: Three times a day (TID) | ORAL | 0 refills | Status: DC | PRN

## 2022-09-17 MED ORDER — FAMOTIDINE IN NACL 20-0.9 MG/50ML-% IV SOLN
20.0000 mg | Freq: Once | INTRAVENOUS | Status: AC
  Administered 2022-09-17: 20 mg via INTRAVENOUS
  Filled 2022-09-17: qty 50

## 2022-09-17 NOTE — Discharge Instructions (Signed)

## 2022-09-17 NOTE — MAU Note (Signed)
ADELY FACER is a 26 y.o.  here in MAU reporting: unable to hold anything down.  Nausea medicine isn't helping.  Feeling really weak.  Some discomfort in upper abd. No bleeding.  Has been seen in office for preg, +IUP on [place]. LMP: 7/24 Onset of complaint: ongoing problem since 6/7 wks Pain score: 2 Vitals:   09/17/22 1248  BP: 113/71  Pulse: 78  Resp: 16  Temp: 97.7 F (36.5 C)  SpO2: 100%     FHT:162 Lab orders placed from triage:  ua

## 2022-09-17 NOTE — MAU Provider Note (Signed)
History     [id number]  Arrival date and time: 09/17/22 1235    Chief Complaint  Patient presents with   Emesis     HPI Cynthia Klein is a 26 y.o. at 29w2dwho presents for nausea & vomiting. Symptoms started at about 6 weeks. Was taking zofran but switched to phenergan a few days ago. Symptoms worse since switching to phenergan. Was previously vomiting 1-2 times per day. Reports vomiting 10 times today. Also reports some epigastric discomfort. Denies lower abdominal pain, fever, vaginal bleeding, or diarrhea.    OB History     Gravida  1   Para      Term      Preterm      AB      Living         SAB      IAB      Ectopic      Multiple      Live Births              Past Medical History:  Diagnosis Date   Asthma    Chicken pox    ?   Frequent headaches     Past Surgical History:  Procedure Laterality Date   WISDOM TOOTH EXTRACTION      Family History  Problem Relation Age of Onset   Depression Mother    Bipolar disorder Mother    Hypertension Father    Depression Sister    Bipolar disorder Sister    Diabetes Sister        type 1   Depression Sister    Thyroid disease Sister    Diabetes Maternal Uncle    Cancer Paternal Uncle        skin   Diabetes Maternal Grandfather    Dementia Paternal Grandmother    Stroke Neg Hx    Heart disease Neg Hx     Allergies  Allergen Reactions   Penicillins Hives    No current facility-administered medications on file prior to encounter.   Current Outpatient Medications on File Prior to Encounter  Medication Sig Dispense Refill   promethazine (PHENERGAN) 12.5 MG tablet Take 1 tablet every 4-6 hours by oral route as needed.     ondansetron (ZOFRAN) 4 MG tablet TAKE 1 TABLET BY MOUTH EVERY 6 TO 8 HOURS AS NEEDED FOR NAUSEA AND VOMITING       ROS Pertinent positives and negative per HPI, all others reviewed and negative  Physical Exam   BP 108/65 (BP Location: Right Arm)   Pulse 68   Temp  97.7 F (36.5 C) (Oral)   Resp 18   Ht '4\' 11"'$  (1.499 m)   Wt 53.7 kg   LMP 06/23/2022   SpO2 99%   BMI 23.91 kg/m   Patient Vitals for the past 24 hrs:  BP Temp Temp src Pulse Resp SpO2 Height Weight  09/17/22 1312 108/65 -- -- 68 18 99 % -- --  09/17/22 1248 113/71 97.7 F (36.5 C) Oral 78 16 100 % '4\' 11"'$  (1.499 m) 53.7 kg    Physical Exam Vitals and nursing note reviewed.  Constitutional:      General: She is not in acute distress.    Appearance: Normal appearance.  HENT:     Head: Normocephalic and atraumatic.  Eyes:     General: No scleral icterus.    Conjunctiva/sclera: Conjunctivae normal.     Pupils: Pupils are equal, round, and reactive to light.  Cardiovascular:  Rate and Rhythm: Normal rate and regular rhythm.     Heart sounds: Normal heart sounds.  Pulmonary:     Effort: Pulmonary effort is normal. No respiratory distress.     Breath sounds: Normal breath sounds. No wheezing.  Abdominal:     General: Abdomen is flat. Bowel sounds are normal.     Palpations: Abdomen is soft.     Tenderness: There is no abdominal tenderness.  Skin:    General: Skin is warm and dry.  Neurological:     Mental Status: She is alert.  Psychiatric:        Mood and Affect: Mood normal.        Behavior: Behavior normal.        Labs Results for orders placed or performed during the hospital encounter of 09/17/22 (from the past 24 hour(s))  Urinalysis, Routine w reflex microscopic Urine, Clean Catch     Status: Abnormal   Collection Time: 09/17/22  1:15 PM  Result Value Ref Range   Color, Urine YELLOW YELLOW   APPearance HAZY (A) CLEAR   Specific Gravity, Urine 1.027 1.005 - 1.030   pH 6.0 5.0 - 8.0   Glucose, UA NEGATIVE NEGATIVE mg/dL   Hgb urine dipstick NEGATIVE NEGATIVE   Bilirubin Urine NEGATIVE NEGATIVE   Ketones, ur 80 (A) NEGATIVE mg/dL   Protein, ur 30 (A) NEGATIVE mg/dL   Nitrite NEGATIVE NEGATIVE   Leukocytes,Ua MODERATE (A) NEGATIVE   RBC / HPF 0-5 0 -  5 RBC/hpf   WBC, UA 21-50 0 - 5 WBC/hpf   Bacteria, UA RARE (A) NONE SEEN   Squamous Epithelial / LPF 6-10 0 - 5   Mucus PRESENT     Imaging No results found.  MAU Course  Procedures Lab Orders         Urinalysis, Routine w reflex microscopic Urine, Clean Catch    Meds ordered this encounter  Medications   lactated ringers bolus 1,000 mL   famotidine (PEPCID) IVPB 20 mg premix   metoCLOPramide (REGLAN) injection 10 mg   metoCLOPramide (REGLAN) 10 MG tablet    Sig: Take 1 tablet (10 mg total) by mouth every 8 (eight) hours as needed for vomiting or nausea.    Dispense:  30 tablet    Refill:  0    Order Specific Question:   Supervising Provider    Answer:   Janyth Pupa [[id number]]   ondansetron (ZOFRAN-ODT) 4 MG disintegrating tablet    Sig: Take 1 tablet (4 mg total) by mouth every 8 (eight) hours as needed for vomiting or refractory nausea / vomiting.    Dispense:  15 tablet    Refill:  0    Order Specific Question:   Supervising Provider    Answer:   Janyth Pupa [[id number]]   Imaging Orders  No imaging studies ordered today    MDM FHT present via doppler  Treated in MAU with IV fluids, reglan, & pepcid. Reports improvement in symptoms & able to keep down ginger ale. Will prescribe reglan for at home.  Assessment and Plan   1. Nausea and vomiting during pregnancy   2. [redacted] weeks gestation of pregnancy    -D/c phenergan -Refilled zofran per patient request -Rx reglan -Take OTC pepcid daily -Reviewed reasons to return to [place], NP 09/17/22 3:28 PM

## 2022-09-24 ENCOUNTER — Encounter: Payer: Self-pay | Admitting: Internal Medicine

## 2023-01-07 LAB — OB RESULTS CONSOLE HIV ANTIBODY (ROUTINE TESTING): HIV: NONREACTIVE

## 2023-01-19 ENCOUNTER — Other Ambulatory Visit: Payer: Self-pay | Admitting: Obstetrics and Gynecology

## 2023-01-19 DIAGNOSIS — R1013 Epigastric pain: Secondary | ICD-10-CM

## 2023-01-20 ENCOUNTER — Ambulatory Visit
Admission: RE | Admit: 2023-01-20 | Discharge: 2023-01-20 | Disposition: A | Discharge status: Home / Self Care | Payer: BC Managed Care – PPO | Source: Ambulatory Visit | Attending: Obstetrics and Gynecology | Admitting: Obstetrics and Gynecology

## 2023-01-20 DIAGNOSIS — R1013 Epigastric pain: Secondary | ICD-10-CM

## 2023-01-21 ENCOUNTER — Encounter: Payer: Self-pay | Admitting: Internal Medicine

## 2023-03-08 LAB — OB RESULTS CONSOLE GBS: GBS: NEGATIVE

## 2023-03-31 ENCOUNTER — Inpatient Hospital Stay (EMERGENCY_DEPARTMENT_HOSPITAL)
Admission: AD | Admit: 2023-03-31 | Discharge: 2023-03-31 | Disposition: A | Discharge status: Home / Self Care | Payer: BC Managed Care – PPO | Source: Home / Self Care | Attending: Obstetrics and Gynecology | Admitting: Obstetrics and Gynecology

## 2023-03-31 ENCOUNTER — Encounter (HOSPITAL_COMMUNITY): Payer: Self-pay | Admitting: Obstetrics and Gynecology

## 2023-03-31 DIAGNOSIS — O26893 Other specified pregnancy related conditions, third trimester: Secondary | ICD-10-CM

## 2023-03-31 DIAGNOSIS — H538 Other visual disturbances: Secondary | ICD-10-CM | POA: Insufficient documentation

## 2023-03-31 DIAGNOSIS — O480 Post-term pregnancy: Secondary | ICD-10-CM | POA: Insufficient documentation

## 2023-03-31 DIAGNOSIS — Z3183 Encounter for assisted reproductive fertility procedure cycle: Secondary | ICD-10-CM

## 2023-03-31 DIAGNOSIS — G44209 Tension-type headache, unspecified, not intractable: Secondary | ICD-10-CM | POA: Insufficient documentation

## 2023-03-31 DIAGNOSIS — O99810 Abnormal glucose complicating pregnancy: Secondary | ICD-10-CM | POA: Diagnosis present

## 2023-03-31 DIAGNOSIS — O99353 Diseases of the nervous system complicating pregnancy, third trimester: Secondary | ICD-10-CM | POA: Insufficient documentation

## 2023-03-31 DIAGNOSIS — Z3A40 40 weeks gestation of pregnancy: Secondary | ICD-10-CM

## 2023-03-31 NOTE — Discharge Instructions (Signed)
Consider taking Tylenol and Benadryl if your HA continues

## 2023-03-31 NOTE — MAU Provider Note (Signed)
History     CSN: [id number]  Arrival date and time: 03/31/23 1449   Event Date/Time   First Provider Initiated Contact with Patient 03/31/23 1542      Chief Complaint  Patient presents with   Headache   HPI Patient is 27 y.o. G1P0 [redacted]w[redacted]d here with complaints of headache. Onset while at work. She reports associated blurry vision. She took Tylenol at 1:30PM, reports improved symptoms. She reports she is here to make sure everything is ok with her baby. Has history of HA, previously when she was on OCP.   Pregnancy is result of IVF  Partner is at bedside- Tollie Eth  +FM, denies LOF, VB, contractions, vaginal discharge.   OB History     Gravida  1   Para      Term      Preterm      AB      Living         SAB      IAB      Ectopic      Multiple      Live Births              Past Medical History:  Diagnosis Date   Asthma    Chicken pox    ?   Frequent headaches     Past Surgical History:  Procedure Laterality Date   WISDOM TOOTH EXTRACTION      Family History  Problem Relation Age of Onset   Depression Mother    Bipolar disorder Mother    Hypertension Father    Depression Sister    Bipolar disorder Sister    Diabetes Sister        type 1   Depression Sister    Thyroid disease Sister    Diabetes Maternal Uncle    Cancer Paternal Uncle        skin   Diabetes Maternal Grandfather    Dementia Paternal Grandmother    Stroke Neg Hx    Heart disease Neg Hx     Social History   Tobacco Use   Smoking status: Never   Smokeless tobacco: Never  Vaping Use   Vaping Use: Never used  Substance Use Topics   Alcohol use: Not Currently    Comment: occasional   Drug use: No    Allergies:  Allergies  Allergen Reactions   Penicillins Hives    Medications Prior to Admission  Medication Sig Dispense Refill Last Dose   acetaminophen (TYLENOL) 500 MG tablet Take 500 mg by mouth every 6 (six) hours as needed.   t at 1400   metoCLOPramide  (REGLAN) 10 MG tablet Take 1 tablet (10 mg total) by mouth every 8 (eight) hours as needed for vomiting or nausea. 30 tablet 0    ondansetron (ZOFRAN-ODT) 4 MG disintegrating tablet Take 1 tablet (4 mg total) by mouth every 8 (eight) hours as needed for vomiting or refractory nausea / vomiting. 15 tablet 0     Review of Systems  Constitutional:  Negative for chills and fever.  HENT:  Negative for congestion and sore throat.   Eyes:  Negative for pain and visual disturbance.  Respiratory:  Negative for cough, chest tightness and shortness of breath.   Cardiovascular:  Negative for chest pain.  Gastrointestinal:  Negative for abdominal pain, diarrhea, nausea and vomiting.  Endocrine: Negative for cold intolerance and heat intolerance.  Genitourinary:  Negative for dysuria and flank pain.  Musculoskeletal:  Negative for back pain.  Skin:  Negative for rash.  Allergic/Immunologic: Negative for food allergies.  Neurological:  Positive for headaches. Negative for dizziness and light-headedness.  Psychiatric/Behavioral:  Negative for agitation.    Physical Exam   Blood pressure 123/83, pulse 88, temperature 98.5 F (36.9 C), temperature source Oral, resp. rate 16, height 4\' 11"  (1.499 m), weight 67.1 kg, last menstrual period 06/23/2022, SpO2 100 %.  Patient Vitals for the past 24 hrs:  BP Temp Temp src Pulse Resp SpO2 Height Weight  03/31/23 1522 123/83 98.5 F (36.9 C) Oral 88 16 100 % 4\' 11"  (1.499 m) 67.1 kg    Physical Exam Vitals and nursing note reviewed.  Constitutional:      General: She is not in acute distress.    Appearance: She is well-developed.     Comments: Pregnant female  HENT:     Head: Normocephalic and atraumatic.  Eyes:     General: No scleral icterus.    Conjunctiva/sclera: Conjunctivae normal.  Cardiovascular:     Rate and Rhythm: Normal rate.  Pulmonary:     Effort: Pulmonary effort is normal.  Chest:     Chest wall: No tenderness.  Abdominal:      Palpations: Abdomen is soft.     Tenderness: There is no abdominal tenderness. There is no guarding or rebound.     Comments: Gravid  Genitourinary:    Vagina: Normal.  Musculoskeletal:        General: Normal range of motion.     Cervical back: Normal range of motion and neck supple.  Skin:    General: Skin is warm and dry.     Findings: No rash.  Neurological:     Mental Status: She is alert and oriented to person, place, and time.     MAU Course  Procedures I reviewed the patient's fetal monitoring.  Baseline HR: 135  Variability:  moderate Accels:present Decels: none  A/P: Reactive NST  Reassured regarding fetal status.   MDM- LOW Patient seen for HA, partner at bedside to support history taking.   Assessment and Plan   1. Acute non intractable tension-type headache   - BP was the WNL, no need for PEC work up - Cervical exam check unchanged from office visit - Recommended trying Tylenol and Benadryl, patient declined medications her at MAU.  - Discussed keeping her appt tomorrow - Reviewed labor return precautions   Federico Flake 03/31/2023, 3:43 PM

## 2023-03-31 NOTE — MAU Note (Signed)
.  Cynthia Klein is a 27 y.o. at [redacted]w[redacted]d here in MAU reporting: she had a 30 minute episode of blurred vision at 1pm. This was followed by a headache and tingling in her left arm. Headache is still there and the tingling is intermitten. Reports positive fetal movement. Occasional uc. Denies bleeding or ROM. Onset of complaint: 2 hours Pain score: 6/10 Vitals:   03/31/23 1522  BP: 123/83  Pulse: 88  Resp: 16  Temp: 98.5 F (36.9 C)  SpO2: 100%     FHT:142 Lab orders placed from triage:

## 2023-04-02 ENCOUNTER — Inpatient Hospital Stay (HOSPITAL_COMMUNITY): Payer: BC Managed Care – PPO | Admitting: Anesthesiology

## 2023-04-02 ENCOUNTER — Other Ambulatory Visit: Payer: Self-pay

## 2023-04-02 ENCOUNTER — Encounter (HOSPITAL_COMMUNITY): Payer: Self-pay | Admitting: Obstetrics and Gynecology

## 2023-04-02 ENCOUNTER — Inpatient Hospital Stay (HOSPITAL_COMMUNITY)
Admission: AD | Admit: 2023-04-02 | Discharge: 2023-04-06 | DRG: 786 | Disposition: A | Discharge status: Home / Self Care | Payer: BC Managed Care – PPO | Attending: Obstetrics and Gynecology | Admitting: Obstetrics and Gynecology

## 2023-04-02 DIAGNOSIS — O2662 Liver and biliary tract disorders in childbirth: Secondary | ICD-10-CM | POA: Diagnosis not present

## 2023-04-02 DIAGNOSIS — Z880 Allergy status to penicillin: Secondary | ICD-10-CM | POA: Diagnosis not present

## 2023-04-02 DIAGNOSIS — K8020 Calculus of gallbladder without cholecystitis without obstruction: Secondary | ICD-10-CM | POA: Diagnosis present

## 2023-04-02 DIAGNOSIS — O9962 Diseases of the digestive system complicating childbirth: Secondary | ICD-10-CM | POA: Diagnosis present

## 2023-04-02 DIAGNOSIS — O9902 Anemia complicating childbirth: Secondary | ICD-10-CM | POA: Diagnosis present

## 2023-04-02 DIAGNOSIS — O411230 Chorioamnionitis, third trimester, not applicable or unspecified: Secondary | ICD-10-CM | POA: Diagnosis present

## 2023-04-02 DIAGNOSIS — D62 Acute posthemorrhagic anemia: Secondary | ICD-10-CM | POA: Diagnosis not present

## 2023-04-02 DIAGNOSIS — Z98891 History of uterine scar from previous surgery: Secondary | ICD-10-CM

## 2023-04-02 DIAGNOSIS — O480 Post-term pregnancy: Secondary | ICD-10-CM | POA: Diagnosis present

## 2023-04-02 DIAGNOSIS — Z3A40 40 weeks gestation of pregnancy: Secondary | ICD-10-CM | POA: Diagnosis not present

## 2023-04-02 DIAGNOSIS — Z3183 Encounter for assisted reproductive fertility procedure cycle: Principal | ICD-10-CM

## 2023-04-02 DIAGNOSIS — Z3490 Encounter for supervision of normal pregnancy, unspecified, unspecified trimester: Secondary | ICD-10-CM

## 2023-04-02 DIAGNOSIS — O99810 Abnormal glucose complicating pregnancy: Secondary | ICD-10-CM

## 2023-04-02 LAB — CBC
HCT: 28.6 % — ABNORMAL LOW (ref 36.0–46.0)
Hemoglobin: 8.8 g/dL — ABNORMAL LOW (ref 12.0–15.0)
MCH: 22.6 pg — ABNORMAL LOW (ref 26.0–34.0)
MCHC: 30.8 g/dL (ref 30.0–36.0)
MCV: 73.3 fL — ABNORMAL LOW (ref 80.0–100.0)
Platelets: 283 10*3/uL (ref 150–400)
RBC: 3.9 MIL/uL (ref 3.87–5.11)
RDW: 15.9 % — ABNORMAL HIGH (ref 11.5–15.5)
WBC: 13.6 10*3/uL — ABNORMAL HIGH (ref 4.0–10.5)
nRBC: 0 % (ref 0.0–0.2)

## 2023-04-02 LAB — TYPE AND SCREEN: Antibody Screen: NEGATIVE

## 2023-04-02 LAB — RPR: RPR Ser Ql: NONREACTIVE

## 2023-04-02 MED ORDER — TERBUTALINE SULFATE 1 MG/ML IJ SOLN: 0.2500 mg | Freq: Once | INTRAMUSCULAR | Status: DC | PRN

## 2023-04-02 MED ORDER — FENTANYL-BUPIVACAINE-NACL 0.5-0.125-0.9 MG/250ML-% EP SOLN
12.0000 mL/h | EPIDURAL | Status: DC | PRN
  Administered 2023-04-02: 10 mL/h via EPIDURAL
  Administered 2023-04-03: 12 mL/h via EPIDURAL
  Filled 2023-04-02 (×2): qty 250

## 2023-04-02 MED ORDER — PHENYLEPHRINE 80 MCG/ML (10ML) SYRINGE FOR IV PUSH (FOR BLOOD PRESSURE SUPPORT): 80.0000 ug | PREFILLED_SYRINGE | INTRAVENOUS | Status: DC | PRN

## 2023-04-02 MED ORDER — SOD CITRATE-CITRIC ACID 500-334 MG/5ML PO SOLN
30.0000 mL | ORAL | Status: DC | PRN
  Filled 2023-04-02: qty 30

## 2023-04-02 MED ORDER — MISOPROSTOL 25 MCG QUARTER TABLET: 25.0000 ug | ORAL_TABLET | Freq: Once | ORAL | Status: DC

## 2023-04-02 MED ORDER — FLEET ENEMA 7-19 GM/118ML RE ENEM: 1.0000 | ENEMA | RECTAL | Status: DC | PRN

## 2023-04-02 MED ORDER — LIDOCAINE-EPINEPHRINE (PF) 2 %-1:200000 IJ SOLN
INTRAMUSCULAR | Status: DC | PRN
  Administered 2023-04-02: 3 mL via EPIDURAL
  Administered 2023-04-03 (×4): 5 mL via EPIDURAL

## 2023-04-02 MED ORDER — ONDANSETRON HCL 4 MG/2ML IJ SOLN: 4.0000 mg | Freq: Four times a day (QID) | INTRAMUSCULAR | Status: DC | PRN

## 2023-04-02 MED ORDER — MISOPROSTOL 25 MCG QUARTER TABLET
25.0000 ug | ORAL_TABLET | Freq: Once | ORAL | Status: AC
  Administered 2023-04-02: 25 ug via ORAL
  Filled 2023-04-02: qty 1

## 2023-04-02 MED ORDER — EPHEDRINE 5 MG/ML INJ: 10.0000 mg | INTRAVENOUS | Status: DC | PRN

## 2023-04-02 MED ORDER — LACTATED RINGERS IV SOLN
500.0000 mL | INTRAVENOUS | Status: DC | PRN
  Administered 2023-04-02: 1000 mL via INTRAVENOUS

## 2023-04-02 MED ORDER — LIDOCAINE HCL (PF) 1 % IJ SOLN: 30.0000 mL | INTRAMUSCULAR | Status: DC | PRN

## 2023-04-02 MED ORDER — LACTATED RINGERS IV SOLN: INTRAVENOUS | Status: DC

## 2023-04-02 MED ORDER — OXYCODONE-ACETAMINOPHEN 5-325 MG PO TABS: 1.0000 | ORAL_TABLET | ORAL | Status: DC | PRN

## 2023-04-02 MED ORDER — OXYTOCIN-SODIUM CHLORIDE 30-0.9 UT/500ML-% IV SOLN
1.0000 m[IU]/min | INTRAVENOUS | Status: DC
  Administered 2023-04-02: 2 m[IU]/min via INTRAVENOUS
  Filled 2023-04-02: qty 500

## 2023-04-02 MED ORDER — LACTATED RINGERS IV SOLN
500.0000 mL | Freq: Once | INTRAVENOUS | Status: AC
  Administered 2023-04-02: 500 mL via INTRAVENOUS

## 2023-04-02 MED ORDER — MISOPROSTOL 50MCG HALF TABLET
50.0000 ug | ORAL_TABLET | Freq: Once | ORAL | Status: AC
  Administered 2023-04-02: 50 ug via ORAL
  Filled 2023-04-02: qty 1

## 2023-04-02 MED ORDER — ACETAMINOPHEN 325 MG PO TABS: 650.0000 mg | ORAL_TABLET | ORAL | Status: DC | PRN

## 2023-04-02 MED ORDER — OXYTOCIN-SODIUM CHLORIDE 30-0.9 UT/500ML-% IV SOLN: 2.5000 [IU]/h | INTRAVENOUS | Status: DC

## 2023-04-02 MED ORDER — MISOPROSTOL 25 MCG QUARTER TABLET
25.0000 ug | ORAL_TABLET | Freq: Once | ORAL | Status: AC
  Administered 2023-04-02: 25 ug via VAGINAL
  Filled 2023-04-02: qty 1

## 2023-04-02 MED ORDER — DIPHENHYDRAMINE HCL 50 MG/ML IJ SOLN: 12.5000 mg | INTRAMUSCULAR | Status: DC | PRN

## 2023-04-02 MED ORDER — BUPIVACAINE HCL (PF) 0.25 % IJ SOLN
INTRAMUSCULAR | Status: DC | PRN
  Administered 2023-04-02 (×2): 3 mL via EPIDURAL

## 2023-04-02 MED ORDER — OXYCODONE-ACETAMINOPHEN 5-325 MG PO TABS: 2.0000 | ORAL_TABLET | ORAL | Status: DC | PRN

## 2023-04-02 MED ORDER — OXYTOCIN BOLUS FROM INFUSION: 333.0000 mL | Freq: Once | INTRAVENOUS | Status: DC

## 2023-04-02 NOTE — Anesthesia Procedure Notes (Signed)
Epidural Patient location during procedure: OB Start time: 04/02/2023 11:11 PM End time: 04/02/2023 11:23 PM  Staffing Anesthesiologist: Val Eagle, MD Performed: anesthesiologist   Preanesthetic Checklist Completed: patient identified, IV checked, risks and benefits discussed, monitors and equipment checked, pre-op evaluation and timeout performed  Epidural Patient position: sitting Prep: DuraPrep Patient monitoring: heart rate, continuous pulse ox and blood pressure Approach: midline Location: L4-L5 Injection technique: LOR saline  Needle:  Needle type: Tuohy  Needle gauge: 17 G Needle length: 9 cm Needle insertion depth: 6 cm Catheter type: closed end flexible Catheter size: 19 Gauge Catheter at skin depth: 13 cm Test dose: negative and 2% lidocaine with Epi 1:200 K  Assessment Events: blood not aspirated, no cerebrospinal fluid, injection not painful, no injection resistance, no paresthesia and negative IV test  Additional Notes Reason for block:procedure for pain

## 2023-04-02 NOTE — H&P (Signed)
Cynthia Klein is a 27 y.o.prime female presenting for elective IOL at 65 1/7wks  Pt is dated per a 5 week [place]. Her pregnancy was via IVF ( elective single FET of PGT-A tested embryo (14 XX)). She had cholelithiasis without obstruction noted on [place] in 01/2023. No current pain. GBS neg  OB History     Gravida  1   Para      Term      Preterm      AB      Living         SAB      IAB      Ectopic      Multiple      Live Births             Past Medical History:  Diagnosis Date   Asthma    Chicken pox    ?   Frequent headaches    Past Surgical History:  Procedure Laterality Date   WISDOM TOOTH EXTRACTION     Family History: family history includes Bipolar disorder in her mother and sister; Cancer in her paternal uncle; Dementia in her paternal grandmother; Depression in her mother, sister, and sister; Diabetes in her maternal grandfather, maternal uncle, and sister; Hypertension in her father; Thyroid disease in her sister. Social History:  reports that she has never smoked. She has never used smokeless tobacco. She reports that she does not currently use alcohol. She reports that she does not use drugs.     Maternal Diabetes: No Genetic Screening: Declined Maternal Ultrasounds/Referrals: Normal Fetal Ultrasounds or other Referrals:  None Maternal Substance Abuse:  No Significant Maternal Medications:  None Significant Maternal Lab Results:  Group B Strep negative Number of Prenatal Visits:greater than 3 verified prenatal visits Other Comments:  None  Review of Systems  Constitutional:  Positive for fatigue. Negative for activity change.  Eyes:  Negative for photophobia and visual disturbance.  Respiratory:  Negative for chest tightness and shortness of breath.   Cardiovascular:  Positive for leg swelling. Negative for chest pain and palpitations.  Gastrointestinal:  Positive for abdominal pain.  Genitourinary:  Positive for pelvic pain.  Musculoskeletal:   Negative for back pain.  Neurological:  Negative for light-headedness and headaches.  Psychiatric/Behavioral:  The patient is not nervous/anxious.    Maternal Medical History:  Reason for admission: Elective IOL   Contractions: Frequency: irregular.   Perceived severity is mild.   Fetal activity: Perceived fetal activity is normal.   Prenatal complications: Cholelithiasis.   Prenatal Complications - Diabetes: none.   Dilation: 1 Effacement (%): 50 Station: -3 Exam by:: Ashley Mariner, RN Blood pressure 125/79, pulse 95, temperature 98.3 F (36.8 C), temperature source Oral, resp. rate 16, height 4\' 11"  (1.499 m), weight 67 kg, last menstrual period 06/23/2022. Maternal Exam:  Uterine Assessment: Contraction strength is mild.  Contraction frequency is irregular.  Abdomen: Patient reports no abdominal tenderness. Estimated fetal weight is AGA.   Fetal presentation: vertex Introitus: Normal vulva. Vulva is negative for condylomata and lesion.  Normal vagina.  Vagina is negative for condylomata.  Pelvis: adequate for delivery.   Cervix: Cervix evaluated by digital exam.     Fetal Exam Fetal Monitor Review: Baseline rate: 130.  Variability: moderate (6-25 bpm).   Pattern: accelerations present and no decelerations.   Fetal State Assessment: Category I - tracings are normal.   Physical Exam Vitals and nursing note reviewed. Exam conducted with a chaperone present.  Constitutional:  Appearance: She is normal weight.  Pulmonary:     Effort: Pulmonary effort is normal.  Genitourinary:    General: Normal vulva.  Vulva is no lesion.  Musculoskeletal:        General: Normal range of motion.     Cervical back: Normal range of motion.  Skin:    General: Skin is warm and dry.     Capillary Refill: Capillary refill takes 2 to 3 seconds.  Neurological:     General: No focal deficit present.     Mental Status: She is alert and oriented to person, place, and time. Mental status  is at baseline.  Psychiatric:        Mood and Affect: Mood normal.        Behavior: Behavior normal.        Thought Content: Thought content normal.        Judgment: Judgment normal.     Prenatal labs: ABO, Rh: --/--/O POS (05/02 9604) Antibody: NEG (05/02 0659) Rubella:   RPR:    HBsAg:    HIV:    GBS:     Assessment/Plan: 26yo prime at 40 1/7wks for elective IOL  - Admit - oral/vaginal cytotec given; recheck after 4hrs or prn  - Pain relief per pt request  - GBS neg - Anticipate svd   Janean Sark Chloe Bluett 04/02/2023, 9:26 AM

## 2023-04-02 NOTE — Anesthesia Preprocedure Evaluation (Signed)
Anesthesia Evaluation  Patient identified by MRN, date of birth, ID band Patient awake    Reviewed: Allergy & Precautions, Patient's Chart, lab work & pertinent test results  History of Anesthesia Complications Negative for: history of anesthetic complications  Airway Mallampati: III  TM Distance: >3 FB Neck ROM: Full    Dental  (+) Dental Advisory Given   Pulmonary neg pulmonary ROS   breath sounds clear to auscultation       Cardiovascular negative cardio ROS  Rhythm:Regular     Neuro/Psych  Headaches, neg Seizures  negative psych ROS   GI/Hepatic negative GI ROS, Neg liver ROS,,,  Endo/Other  negative endocrine ROS    Renal/GU negative Renal ROS     Musculoskeletal   Abdominal   Peds  Hematology  (+) Blood dyscrasia, anemia Lab Results      Component                Value               Date                      WBC                      13.6 (H)            04/02/2023                HGB                      8.8 (L)             04/02/2023                HCT                      28.6 (L)            04/02/2023                MCV                      73.3 (L)            04/02/2023                PLT                      283                 04/02/2023              Anesthesia Other Findings   Reproductive/Obstetrics (+) Pregnancy                             Anesthesia Physical Anesthesia Plan  ASA: 2  Anesthesia Plan: Epidural   Post-op Pain Management:    Induction:   PONV Risk Score and Plan: 2 and Treatment may vary due to age or medical condition  Airway Management Planned: Natural Airway  Additional Equipment: None  Intra-op Plan:   Post-operative Plan:   Informed Consent: I have reviewed the patients History and Physical, chart, labs and discussed the procedure including the risks, benefits and alternatives for the proposed anesthesia with the patient or authorized  representative who has indicated his/her understanding and acceptance.       Plan Discussed with:  Anesthesia Plan Comments:        Anesthesia Quick Evaluation

## 2023-04-02 NOTE — Progress Notes (Signed)
Cynthia Klein is a 27 y.o. G1P0 at [redacted]w[redacted]d by LMP admitted for induction of labor due to Post dates. Due date 04/01/2023.  Subjective:   Objective: BP 124/81   Pulse 100   Temp 98.3 F (36.8 C) (Oral)   Resp 16   Ht 4\' 11"  (1.499 m)   Wt 67 kg   LMP 06/23/2022   BMI 29.83 kg/m  No intake/output data recorded. No intake/output data recorded.  FHT:  FHR: 120 bpm, variability: moderate,  accelerations:  Present,  decelerations:  Absent UC:   irregular, every 5 minutes SVE:   Dilation: 1 Effacement (%): 70 Station: -2 Exam by:: Dr. Mindi Slicker  Labs: Lab Results  Component Value Date   WBC 13.6 (H) 04/02/2023   HGB 8.8 (L) 04/02/2023   HCT 28.6 (L) 04/02/2023   MCV 73.3 (L) 04/02/2023   PLT 283 04/02/2023    Assessment / Plan: Induction of labor due to term with favorable cervix,  progressing well on pitocin  Labor:  s/p cytotec oral/vaginal. Plan for cytotec now given sve 1/70/-2 Preeclampsia:   n/a Fetal Wellbeing:  Category I Pain Control:  Labor support without medications I/D:  n/a Anticipated MOD:  NSVD  Cynthia Muster, DO 04/02/2023, 12:33 PM

## 2023-04-02 NOTE — Progress Notes (Signed)
Cynthia Klein is a 27 y.o. G1P0 at [redacted]w[redacted]d  Subjective: Pt reports contractions are intermittently painful ; worse in certain positions.   Objective: BP 126/87   Pulse 88   Temp 98.2 F (36.8 C) (Oral)   Resp 16   Ht 4\' 11"  (1.499 m)   Wt 67 kg   LMP 06/23/2022   BMI 29.83 kg/m  No intake/output data recorded. No intake/output data recorded.  FHT:  FHR: 130 bpm, variability: moderate,  accelerations:  Present,  decelerations:  Absent UC:   irregular, every 3-5 minutes SVE:   Dilation: 3 Effacement (%): 70 Station: -2 Exam by:: Dr. Mindi Slicker  Labs: Lab Results  Component Value Date   WBC 13.6 (H) 04/02/2023   HGB 8.8 (L) 04/02/2023   HCT 28.6 (L) 04/02/2023   MCV 73.3 (L) 04/02/2023   PLT 283 04/02/2023    Assessment / Plan: Induction of labor due to term with favorable cervix,  progressing well on pitocin  Labor:  s/p cytotec x two courses. Will monitor to determine contraction pattern better then augment with pitocin if able. Pt finding sve very painful hence not able to AROM at thsi time Preeclampsia:   n/a Fetal Wellbeing:  Category I Pain Control:  Labor support without medications I/D:  n/a Anticipated MOD:  NSVD  Cynthia Muster, DO 04/02/2023, 7:19 PM

## 2023-04-03 ENCOUNTER — Other Ambulatory Visit: Payer: Self-pay

## 2023-04-03 ENCOUNTER — Encounter (HOSPITAL_COMMUNITY)
Admission: AD | Disposition: A | Discharge status: Home / Self Care | Payer: Self-pay | Source: Home / Self Care | Attending: Obstetrics and Gynecology

## 2023-04-03 ENCOUNTER — Encounter (HOSPITAL_COMMUNITY): Payer: Self-pay | Admitting: Obstetrics and Gynecology

## 2023-04-03 DIAGNOSIS — O480 Post-term pregnancy: Secondary | ICD-10-CM

## 2023-04-03 DIAGNOSIS — Z3A40 40 weeks gestation of pregnancy: Secondary | ICD-10-CM

## 2023-04-03 DIAGNOSIS — O2662 Liver and biliary tract disorders in childbirth: Secondary | ICD-10-CM

## 2023-04-03 DIAGNOSIS — O411230 Chorioamnionitis, third trimester, not applicable or unspecified: Secondary | ICD-10-CM

## 2023-04-03 LAB — TYPE AND SCREEN: Unit division: 0

## 2023-04-03 LAB — BPAM RBC
Blood Product Expiration Date: 202406032359
ISSUE DATE / TIME: 202405032111
Unit Type and Rh: 5100

## 2023-04-03 LAB — PREPARE RBC (CROSSMATCH)

## 2023-04-03 LAB — ABO/RH: ABO/RH(D): O POS

## 2023-04-03 SURGERY — Surgical Case
Anesthesia: Epidural

## 2023-04-03 MED ORDER — CLINDAMYCIN PHOSPHATE 900 MG/50ML IV SOLN
900.0000 mg | Freq: Three times a day (TID) | INTRAVENOUS | Status: AC
  Administered 2023-04-03 – 2023-04-04 (×3): 900 mg via INTRAVENOUS
  Filled 2023-04-03 (×4): qty 50

## 2023-04-03 MED ORDER — HYDROMORPHONE HCL 1 MG/ML IJ SOLN
0.2500 mg | INTRAMUSCULAR | Status: DC | PRN
  Administered 2023-04-03: 0.5 mg via INTRAVENOUS

## 2023-04-03 MED ORDER — SODIUM CHLORIDE 0.9 % IR SOLN
Status: DC | PRN
  Administered 2023-04-03: 1

## 2023-04-03 MED ORDER — MEPERIDINE HCL 25 MG/ML IJ SOLN
INTRAMUSCULAR | Status: DC | PRN
  Administered 2023-04-03 (×2): 12.5 mg via INTRAVENOUS

## 2023-04-03 MED ORDER — SODIUM CHLORIDE 0.9% IV SOLUTION: Freq: Once | INTRAVENOUS | Status: DC

## 2023-04-03 MED ORDER — KETOROLAC TROMETHAMINE 30 MG/ML IJ SOLN: 30.0000 mg | Freq: Four times a day (QID) | INTRAMUSCULAR | Status: AC | PRN

## 2023-04-03 MED ORDER — MEPERIDINE HCL 25 MG/ML IJ SOLN: 6.2500 mg | INTRAMUSCULAR | Status: DC | PRN

## 2023-04-03 MED ORDER — SOD CITRATE-CITRIC ACID 500-334 MG/5ML PO SOLN
30.0000 mL | ORAL | Status: AC
  Administered 2023-04-03: 30 mL via ORAL

## 2023-04-03 MED ORDER — ONDANSETRON HCL 4 MG/2ML IJ SOLN
INTRAMUSCULAR | Status: DC | PRN
  Administered 2023-04-03: 4 mg via INTRAVENOUS

## 2023-04-03 MED ORDER — FENTANYL CITRATE (PF) 100 MCG/2ML IJ SOLN
INTRAMUSCULAR | Status: AC
  Filled 2023-04-03: qty 2

## 2023-04-03 MED ORDER — VANCOMYCIN HCL 10 G IV SOLR: 15.0000 mg/kg | Freq: Three times a day (TID) | INTRAVENOUS | Status: DC

## 2023-04-03 MED ORDER — FENTANYL CITRATE (PF) 100 MCG/2ML IJ SOLN
INTRAMUSCULAR | Status: DC | PRN
  Administered 2023-04-03: 100 ug via EPIDURAL

## 2023-04-03 MED ORDER — ACETAMINOPHEN 500 MG PO TABS
1000.0000 mg | ORAL_TABLET | Freq: Once | ORAL | Status: AC
  Administered 2023-04-03: 1000 mg via ORAL
  Filled 2023-04-03: qty 2

## 2023-04-03 MED ORDER — DEXMEDETOMIDINE HCL IN NACL 80 MCG/20ML IV SOLN
INTRAVENOUS | Status: DC | PRN
  Administered 2023-04-03: 12 ug via INTRAVENOUS
  Administered 2023-04-03: 8 ug via INTRAVENOUS

## 2023-04-03 MED ORDER — OXYTOCIN-SODIUM CHLORIDE 30-0.9 UT/500ML-% IV SOLN
INTRAVENOUS | Status: DC | PRN
  Administered 2023-04-03: 30 [IU] via INTRAVENOUS

## 2023-04-03 MED ORDER — LIDOCAINE-EPINEPHRINE (PF) 1 %-1:200000 IJ SOLN
INTRAMUSCULAR | Status: DC | PRN
  Administered 2023-04-03: 10 mL via INTRADERMAL

## 2023-04-03 MED ORDER — GENTAMICIN SULFATE 40 MG/ML IJ SOLN
5.0000 mg/kg | INTRAVENOUS | Status: AC
  Administered 2023-04-03: 335.2 mg via INTRAVENOUS
  Administered 2023-04-04: 340 mg via INTRAVENOUS
  Filled 2023-04-03 (×3): qty 8.5

## 2023-04-03 MED ORDER — DEXAMETHASONE SODIUM PHOSPHATE 4 MG/ML IJ SOLN
INTRAMUSCULAR | Status: AC
  Filled 2023-04-03: qty 1

## 2023-04-03 MED ORDER — HYDROMORPHONE HCL 1 MG/ML IJ SOLN
INTRAMUSCULAR | Status: AC
  Filled 2023-04-03: qty 1

## 2023-04-03 MED ORDER — ONDANSETRON HCL 4 MG/2ML IJ SOLN: 4.0000 mg | Freq: Three times a day (TID) | INTRAMUSCULAR | Status: DC | PRN

## 2023-04-03 MED ORDER — KETOROLAC TROMETHAMINE 30 MG/ML IJ SOLN: 30.0000 mg | Freq: Once | INTRAMUSCULAR | Status: DC | PRN

## 2023-04-03 MED ORDER — SODIUM CHLORIDE 0.9% FLUSH: 3.0000 mL | INTRAVENOUS | Status: DC | PRN

## 2023-04-03 MED ORDER — LIDOCAINE-EPINEPHRINE (PF) 2 %-1:200000 IJ SOLN
INTRAMUSCULAR | Status: AC
  Filled 2023-04-03: qty 20

## 2023-04-03 MED ORDER — STERILE WATER FOR IRRIGATION IR SOLN
Status: DC | PRN
  Administered 2023-04-03: 1

## 2023-04-03 MED ORDER — ONDANSETRON HCL 4 MG/2ML IJ SOLN
4.0000 mg | Freq: Once | INTRAMUSCULAR | Status: AC | PRN
  Administered 2023-04-03: 4 mg via INTRAVENOUS

## 2023-04-03 MED ORDER — TRANEXAMIC ACID-NACL 1000-0.7 MG/100ML-% IV SOLN
INTRAVENOUS | Status: DC | PRN
  Administered 2023-04-03: 1000 mg via INTRAVENOUS

## 2023-04-03 MED ORDER — MORPHINE SULFATE (PF) 0.5 MG/ML IJ SOLN
INTRAMUSCULAR | Status: AC
  Filled 2023-04-03: qty 10

## 2023-04-03 MED ORDER — METHYLERGONOVINE MALEATE 0.2 MG/ML IJ SOLN
INTRAMUSCULAR | Status: DC | PRN
  Administered 2023-04-03: .2 mg via INTRAMUSCULAR

## 2023-04-03 MED ORDER — ONDANSETRON HCL 4 MG/2ML IJ SOLN
INTRAMUSCULAR | Status: AC
  Filled 2023-04-03: qty 2

## 2023-04-03 MED ORDER — NALOXONE HCL 0.4 MG/ML IJ SOLN: 0.4000 mg | INTRAMUSCULAR | Status: DC | PRN

## 2023-04-03 MED ORDER — ALBUMIN HUMAN 5 % IV SOLN
INTRAVENOUS | Status: AC
  Filled 2023-04-03: qty 250

## 2023-04-03 MED ORDER — AMISULPRIDE (ANTIEMETIC) 5 MG/2ML IV SOLN: 10.0000 mg | Freq: Once | INTRAVENOUS | Status: DC | PRN

## 2023-04-03 MED ORDER — SCOPOLAMINE 1 MG/3DAYS TD PT72
1.0000 | MEDICATED_PATCH | Freq: Once | TRANSDERMAL | Status: DC
  Administered 2023-04-03: 1.5 mg via TRANSDERMAL

## 2023-04-03 MED ORDER — MEPERIDINE HCL 25 MG/ML IJ SOLN
INTRAMUSCULAR | Status: AC
  Filled 2023-04-03: qty 1

## 2023-04-03 MED ORDER — DIPHENHYDRAMINE HCL 25 MG PO CAPS: 25.0000 mg | ORAL_CAPSULE | ORAL | Status: DC | PRN

## 2023-04-03 MED ORDER — OXYCODONE HCL 5 MG/5ML PO SOLN: 5.0000 mg | Freq: Once | ORAL | Status: DC | PRN

## 2023-04-03 MED ORDER — ALBUMIN HUMAN 5 % IV SOLN: INTRAVENOUS | Status: DC | PRN

## 2023-04-03 MED ORDER — OXYCODONE HCL 5 MG PO TABS: 5.0000 mg | ORAL_TABLET | Freq: Once | ORAL | Status: DC | PRN

## 2023-04-03 MED ORDER — SCOPOLAMINE 1 MG/3DAYS TD PT72
MEDICATED_PATCH | TRANSDERMAL | Status: AC
  Filled 2023-04-03: qty 1

## 2023-04-03 MED ORDER — MORPHINE SULFATE (PF) 0.5 MG/ML IJ SOLN
INTRAMUSCULAR | Status: DC | PRN
  Administered 2023-04-03: 3 mg via EPIDURAL

## 2023-04-03 MED ORDER — NALOXONE HCL 4 MG/10ML IJ SOLN: 1.0000 ug/kg/h | INTRAVENOUS | Status: DC | PRN

## 2023-04-03 MED ORDER — PHENYLEPHRINE 80 MCG/ML (10ML) SYRINGE FOR IV PUSH (FOR BLOOD PRESSURE SUPPORT)
PREFILLED_SYRINGE | INTRAVENOUS | Status: DC | PRN
  Administered 2023-04-03: 40 ug via INTRAVENOUS

## 2023-04-03 MED ORDER — DIPHENHYDRAMINE HCL 50 MG/ML IJ SOLN: 12.5000 mg | INTRAMUSCULAR | Status: DC | PRN

## 2023-04-03 MED ORDER — ACETAMINOPHEN 500 MG PO TABS
1000.0000 mg | ORAL_TABLET | Freq: Four times a day (QID) | ORAL | Status: DC
  Administered 2023-04-04 (×2): 1000 mg via ORAL
  Filled 2023-04-03: qty 2

## 2023-04-03 MED ORDER — DEXAMETHASONE SODIUM PHOSPHATE 10 MG/ML IJ SOLN
INTRAMUSCULAR | Status: DC | PRN
  Administered 2023-04-03: 4 mg via INTRAVENOUS

## 2023-04-03 SURGICAL SUPPLY — 34 items
APL PRP STRL LF DISP 70% ISPRP (MISCELLANEOUS) ×2
APL SKNCLS STERI-STRIP NONHPOA (GAUZE/BANDAGES/DRESSINGS) ×1
BENZOIN TINCTURE PRP APPL 2/3 (GAUZE/BANDAGES/DRESSINGS) ×1 IMPLANT
CHLORAPREP W/TINT 26 (MISCELLANEOUS) ×2 IMPLANT
CLAMP UMBILICAL CORD (MISCELLANEOUS) ×1 IMPLANT
CLIP FILSHIE TUBAL LIGA STRL (Clip) ×1 IMPLANT
CLOTH BEACON ORANGE TIMEOUT ST (SAFETY) ×1 IMPLANT
DRSG OPSITE POSTOP 4X10 (GAUZE/BANDAGES/DRESSINGS) ×1 IMPLANT
ELECT REM PT RETURN 9FT ADLT (ELECTROSURGICAL) ×1
ELECTRODE REM PT RTRN 9FT ADLT (ELECTROSURGICAL) ×1 IMPLANT
EXTRACTOR VACUUM BELL CUP MITY (SUCTIONS) IMPLANT
GLOVE BIO SURGEON STRL SZ 6 (GLOVE) ×1 IMPLANT
GLOVE BIOGEL PI IND STRL 6.5 (GLOVE) ×1 IMPLANT
GOWN STRL REUS W/TWL LRG LVL3 (GOWN DISPOSABLE) ×2 IMPLANT
KIT ABG SYR 3ML LUER SLIP (SYRINGE) ×1 IMPLANT
NDL HYPO 22X1.5 SAFETY MO (MISCELLANEOUS) IMPLANT
NDL HYPO 25X5/8 SAFETYGLIDE (NEEDLE) ×1 IMPLANT
NEEDLE HYPO 22X1.5 SAFETY MO (MISCELLANEOUS) ×1 IMPLANT
NEEDLE HYPO 25X5/8 SAFETYGLIDE (NEEDLE) ×1 IMPLANT
NS IRRIG 1000ML POUR BTL (IV SOLUTION) ×1 IMPLANT
PACK C SECTION WH (CUSTOM PROCEDURE TRAY) ×1 IMPLANT
PAD OB MATERNITY 4.3X12.25 (PERSONAL CARE ITEMS) ×1 IMPLANT
RTRCTR C-SECT PINK 25CM LRG (MISCELLANEOUS) ×1 IMPLANT
STRIP CLOSURE SKIN 1/2X4 (GAUZE/BANDAGES/DRESSINGS) ×1 IMPLANT
STRIP CLOSURE SKIN 1/4X4 (GAUZE/BANDAGES/DRESSINGS) IMPLANT
SUT MNCRL 0 VIOLET CTX 36 (SUTURE) ×2 IMPLANT
SUT VIC AB 0 CT1 36 (SUTURE) ×2 IMPLANT
SUT VIC AB 3-0 CT1 27 (SUTURE) ×1
SUT VIC AB 3-0 CT1 TAPERPNT 27 (SUTURE) ×1 IMPLANT
SUT VIC AB 4-0 KS 27 (SUTURE) ×1 IMPLANT
SYR CONTROL 10ML LL (SYRINGE) IMPLANT
TOWEL OR 17X24 6PK STRL BLUE (TOWEL DISPOSABLE) ×1 IMPLANT
TRAY FOLEY W/BAG SLVR 14FR LF (SET/KITS/TRAYS/PACK) ×1 IMPLANT
WATER STERILE IRR 1000ML POUR (IV SOLUTION) ×1 IMPLANT

## 2023-04-03 NOTE — Progress Notes (Signed)
OB Progress Note  S: pt comfortable   O: Today's Vitals   04/03/23 0900 04/03/23 0930 04/03/23 1000 04/03/23 1030  BP: 118/75 115/75 112/82 117/68  Pulse: 79 84 (!) 106 89  Resp: 15 16 15    Temp:   98.3 F (36.8 C)   TempSrc:   Oral   SpO2:      Weight:      Height:      PainSc: Asleep  0-No pain    Body mass index is 29.83 kg/m.  SVE 6-7/80/-2  FHR: 150bpm, mod variability, + accels. 4 recurrent late decels after exam and patient turned onto side, resolved with position change. Otherwise no decels Toco: ctx q 2-4 mins, MVUs inadequate (180)   A/P: 26Y G1P0 @ [redacted]w[redacted]d, IOL post dates Fetal wellbeing: prior to position change was category 1, lates resolved  IOL: s/p cytotec, s/p AROM, on pitocin (16mU/mL) with IUPC in place, continue to titrate Pain control: epidural  M. Timothy Lasso, MD 04/03/23 10:35 AM

## 2023-04-03 NOTE — Op Note (Signed)
CESAREAN SECTION Procedure Note  Patient: Cynthia Klein is a 27 y.o. G1P1001 @ [redacted]w[redacted]d  Preoperative Diagnosis:  Intrauterine pregnancy at 40 weeks 2 days Arrest of dilation Anemia Chorioamnionitis  Postoperative Diagnosis: same, delivered  Procedure: Primary low transverse cesarean with double layer uterine closure     Surgeon: Charlett Nose , MD  Assistant: Lavonda Jumbo, DO  An experienced assistant was required given the standard of surgical care given the complexity of the case.  This assistant was needed for exposure, dissection, suctioning, retraction, instrument exchange, assisting with delivery with administration of fundal pressure, and for overall help during the procedure.  Anesthesia: Epidural anesthesia and local anesthesia  Findings: Normal appearing uterus, fallopian tubes bilaterally, and ovaries bilaterally.  Viable female infant in vertex, LOA  presentation delivered at 1942 with weight 3620g (7 pounds 15.7 ounces) Apgars 8 and 9.  Estimated Blood Loss:  710 mL intra-op and with first fundal massage         Specimens: Placenta to pathology         Complications:  postpartum hemorrhage         Disposition: PACU - hemodynamically stable.         Condition: stable    Description of Procedure: The patient was taken to the operating room where epidural anesthesia was rebolused and found to be adequate.  The patient was placed in the dorsal supine position.  Fetal heart tones were confirmed. Thromboguards were applied and cycling. A foley catheter was in place and draining. Gentamicin and clindamycin were given for infection prophylaxis. The patient was subsequently prepped and draped in the normal sterile fashion.    During Allis test, patient persistently felt sharp pain in her left lower quadrant despite additional bolus of epidural anesthesia. 10mL of 2% lidocaine was administered on the left side of the anticipated skin incision. Adequate  anesthesia was obtained. A low transverse skin incision was made with a scalpel and carried down to the level of the fascia with the Bovie.  The fascia was incised in the midline with the scalpel and extended laterally with curved Mayo scissors.  Kocher clamps were applied to the inferior fascial edge and the fascia was dissected off the rectus muscle sharply using the Mayo scissors.  The Kocher clamps were transferred to the superior fascial edge and the underlying rectus muscle was dissected off with curved Mayo's scissors.  The rectus muscles then were separated in the midline.  The peritoneum was found free of adherent bowel and the peritoneal cavity was entered with Metzenbaum scissors.  The uterus was identified and the alexis retractor was placed intraperitoneal.  A bladder flap was then created sharply with Metzenbaum scissors and separated from the lower uterine segment digitally.   A low transverse hysterotomy was then made with a scalpel.  The infant was found in the vertex presentation was delivered atraumatically and without difficulty with standard maneuvers. After 60 seconds of delayed cord clamping the cord was clamped and cut and the infant was handed off to the pediatricians.  The placenta was delivered with gentle traction on umbilical cord and manual massage of the uterine fundus.  The uterus was cleared of all clot and debris.  The hysterotomy was then closed with 0 monocryl in a running locked fashion,  followed by 0 Monocryl in an imbricating fashion. Two figure of 8 stitches of 0 monocryl were used to obtain hemostasis at the left hysterotomy angle.  The hysterotomy was found to be hemostatic.  Due to patient discomfort with intraperitoneal manipulation, the peritoneal layer was not closed. The fascia was closed with a 0 Vicryl suture in a continuous running fashion.  The subcutaneous tissue was irrigated and rendered hemostatic with cautery.  The subcutaneous layer was subsequently closed  with 3-0 Vicryl in a continuous running fashion.  The skin was closed with 4-0 vicryl  in a running subcuticular fashion.  Sponge, lap and needle counts were correct. Steri strips and a Honeycomb dressing were placed on the incision.   At this point, estimated blood loss was . I was called back into the OR due to blood loss with fundal rub. IM Methergine was administered. I performed next fundal massage with minimal blood expressed. Given total EBL of and starting hgb of 8.6, 2 units of packed red blood cells were ordered to be administered in PACU. Patient agrees with blood transfusion. Vital signs stable.   Charlett Nose 04/03/23  9:51 PM

## 2023-04-03 NOTE — Transfer of Care (Signed)
Immediate Anesthesia Transfer of Care Note  Patient: Cynthia Klein  Procedure(s) Performed: CESAREAN SECTION  Patient Location: PACU  Anesthesia Type:Epidural  Level of Consciousness: awake, alert , and oriented  Airway & Oxygen Therapy: Patient Spontanous Breathing  Post-op Assessment: Report given to RN and Post -op Vital signs reviewed and stable  Post vital signs: Reviewed and stable  Last Vitals:  Vitals Value Taken Time  BP 122/79 04/03/23 2045  Temp 37.1 C 04/03/23 2030  Pulse 99 04/03/23 2046  Resp 24 04/03/23 2046  SpO2 95 % 04/03/23 2046  Vitals shown include unvalidated device data.  Last Pain:  Vitals:   04/03/23 2030  TempSrc: Oral  PainSc:          Complications: No notable events documented.

## 2023-04-03 NOTE — Anesthesia Postprocedure Evaluation (Signed)
Anesthesia Post Note  Patient: EMRA LIVSHITS  Procedure(s) Performed: CESAREAN SECTION     Patient location during evaluation: PACU Anesthesia Type: Epidural Level of consciousness: awake and alert and oriented Pain management: pain level controlled Vital Signs Assessment: post-procedure vital signs reviewed and stable Respiratory status: spontaneous breathing, nonlabored ventilation and respiratory function stable Cardiovascular status: blood pressure returned to baseline and stable Postop Assessment: no headache, no backache, epidural receding and no apparent nausea or vomiting Anesthetic complications: no Comments: Additional PIV started in PACU, labs drawn. 2 units prbc given for EBL 1.5L, symptomatic anemia (starting Hb 8.8)   No notable events documented.  Last Vitals:  Vitals:   04/03/23 2210 04/03/23 2318  BP: 107/66 (!) 137/93  Pulse:  89  Resp:  18  Temp:  36.9 C  SpO2:  95%    Last Pain:  Vitals:   04/03/23 2318  TempSrc: Oral  PainSc:    Pain Goal:                   Lannie Fields

## 2023-04-03 NOTE — Progress Notes (Signed)
Patient ID: Cynthia Klein, female   DOB: 06/24/1996, 27 y.o.   MRN: [id number] Pt reports some discomfort with contractions on her right.  VSS EFM - cat 1, 145 TOCO - ctxs q 3-79mins  SVE - 5.5/75/-2  Pit at   A/P: Progressing well, still in latent labor - may be transitioning now , adequate mvus  - Expectant mgmt

## 2023-04-03 NOTE — Progress Notes (Signed)
OB Progress Note  S: pt comfortable   O: Today's Vitals   04/03/23 1300 04/03/23 1305 04/03/23 1330 04/03/23 1400  BP: 109/70  127/83 130/82  Pulse: 88  77 79  Resp: 16  15 17   Temp:      TempSrc:      SpO2:      Weight:      Height:      PainSc:  0-No pain     Body mass index is 29.83 kg/m.  SVE 6.5/80/-2, unchanged  FHR: 145bpm, mod variability, + accels, no decels Toco: ctx q 2-4 mins, MVUS 80-100   A/P: 26Y G1P0 @ [redacted]w[redacted]d, IOL post dates Fetal wellbeing: cat I tracing IOL: s/p cytotec, s/p AROM, on pitocin (69mU/mL) with IUPC in place. MVUs inadequate and decreasing in pressure and frequency despite increasing pitocin. Pitocin started at 2000 last night. Discussed 4 hours with no change with patient. Discussed criteria for arrest of dilation. Could continue 2 more hours with inadequate MVUs or give short pitocin break to attempt to increase contraction strength. Will give pitocin break and then restart. Patient agrees.  Pain control: epidural   M. Timothy Lasso, MD 04/03/23  2:19 PM

## 2023-04-03 NOTE — Progress Notes (Signed)
ELISAH MARKING is a 27 y.o. G1P0 at [redacted]w[redacted]d   Subjective: Pt comfortable with no complaints  Objective: BP 122/80   Pulse 71   Temp 98.1 F (36.7 C)   Resp 16   Ht 4\' 11"  (1.499 m)   Wt 67 kg   LMP 06/23/2022   SpO2 97%   BMI 29.83 kg/m  No intake/output data recorded. No intake/output data recorded.  FHT:  FHR: 140 bpm, variability: moderate,  accelerations:  Present,  decelerations:  Absent UC:   regular, every 1-2 minutes SVE:   Dilation: 4.5 Effacement (%): 80 Station: -2 Exam by:: Dr. Mindi Slicker  Labs: Lab Results  Component Value Date   WBC 13.6 (H) 04/02/2023   HGB 8.8 (L) 04/02/2023   HCT 28.6 (L) 04/02/2023   MCV 73.3 (L) 04/02/2023   PLT 283 04/02/2023    Assessment / Plan: Induction of labor due to postterm,  progressing well on pitocin  Labor: Progressing normally IUPC placed  Preeclampsia:   n/a Fetal Wellbeing:  Category I Pain Control:  Epidural I/D:  n/a Anticipated MOD:  NSVD  [name], DO 04/03/2023, 2:54 AM

## 2023-04-03 NOTE — Progress Notes (Signed)
Pt comfortable.   Today's Vitals   04/03/23 1630 04/03/23 1700 04/03/23 1730 04/03/23 1757  BP: 125/71 122/73 117/70   Pulse: 83 80 77   Resp: 18 16 16    Temp:  (!) 100.9 F (38.3 C)  (!) 100.8 F (38.2 C)  TempSrc:  Oral  Oral  SpO2:      Weight:      Height:      PainSc:  0-No pain     Body mass index is 29.83 kg/m.  SVE 6.5/80/-2, exam unchanged  FHR 175bpm, mod variability, + accels, no decels Toco: ctx q 3 mins, MVUs 110s  26Y G1P0 @ [redacted]w[redacted]d, IOL post dates - Discussed findings with patient. Exam unchanged since 1012 this morning. Attempted pitocin break and still unable to achieve adequate contractions. Meets criteria for arrest of dilation. Recommend cesarean delivery. Patient agrees. Reviewed procedure in detail, including risk of infection, bleeding, damage to surrounding organs, blood transfusion, hysterectomy, injury to infant. All questions answered. Consent signed. - Febrile with fetal tachycardia, meets chorioamnionitis criteria. Spoke to pharmacy, will do gentamicin and clindamycin to cover for chorio and cesarean in patient with penicillin allergy.  - NPO  Alinda Deem, MD 04/03/23 6:23 PM

## 2023-04-03 NOTE — Progress Notes (Signed)
Patient ID: Cynthia Klein, female   DOB: 06/06/1996, 27 y.o.   MRN: [id number] Pt comfortable with epidural.  VSS EFM - 140, +accels, -decels, cat 1 TOCO - ctxs q  SVE -  4/75/-2  A/P: Progressing well on pitocin now at and now comfortable with epidural    - AROM with clear fluid noted    - expectant mgmt

## 2023-04-04 LAB — BPAM RBC
Blood Product Expiration Date: 202406032359
ISSUE DATE / TIME: 202405032111
Unit Type and Rh: 5100

## 2023-04-04 LAB — TYPE AND SCREEN
ABO/RH(D): O POS
Unit division: 0

## 2023-04-04 LAB — CBC
HCT: 18.9 % — ABNORMAL LOW (ref 36.0–46.0)
HCT: 28.1 % — ABNORMAL LOW (ref 36.0–46.0)
Hemoglobin: 5.8 g/dL — CL (ref 12.0–15.0)
Hemoglobin: 9.5 g/dL — ABNORMAL LOW (ref 12.0–15.0)
MCH: 22.7 pg — ABNORMAL LOW (ref 26.0–34.0)
MCH: 26 pg (ref 26.0–34.0)
MCHC: 30.7 g/dL (ref 30.0–36.0)
MCHC: 33.8 g/dL (ref 30.0–36.0)
MCV: 73.8 fL — ABNORMAL LOW (ref 80.0–100.0)
MCV: 76.8 fL — ABNORMAL LOW (ref 80.0–100.0)
Platelets: 197 10*3/uL (ref 150–400)
Platelets: 201 10*3/uL (ref 150–400)
RBC: 2.56 MIL/uL — ABNORMAL LOW (ref 3.87–5.11)
RBC: 3.66 MIL/uL — ABNORMAL LOW (ref 3.87–5.11)
RDW: 16.3 % — ABNORMAL HIGH (ref 11.5–15.5)
RDW: 19 % — ABNORMAL HIGH (ref 11.5–15.5)
WBC: 19.6 10*3/uL — ABNORMAL HIGH (ref 4.0–10.5)
WBC: 34.7 10*3/uL — ABNORMAL HIGH (ref 4.0–10.5)
nRBC: 0 % (ref 0.0–0.2)
nRBC: 0 % (ref 0.0–0.2)

## 2023-04-04 MED ORDER — DIBUCAINE (PERIANAL) 1 % EX OINT: 1.0000 | TOPICAL_OINTMENT | CUTANEOUS | Status: DC | PRN

## 2023-04-04 MED ORDER — TETANUS-DIPHTH-ACELL PERTUSSIS 5-2.5-18.5 LF-MCG/0.5 IM SUSY: 0.5000 mL | PREFILLED_SYRINGE | Freq: Once | INTRAMUSCULAR | Status: DC

## 2023-04-04 MED ORDER — ZOLPIDEM TARTRATE 5 MG PO TABS: 5.0000 mg | ORAL_TABLET | Freq: Every evening | ORAL | Status: DC | PRN

## 2023-04-04 MED ORDER — IBUPROFEN 600 MG PO TABS: 600.0000 mg | ORAL_TABLET | Freq: Four times a day (QID) | ORAL | Status: DC

## 2023-04-04 MED ORDER — IBUPROFEN 600 MG PO TABS
600.0000 mg | ORAL_TABLET | Freq: Four times a day (QID) | ORAL | Status: DC
  Administered 2023-04-05 – 2023-04-06 (×7): 600 mg via ORAL
  Filled 2023-04-04 (×7): qty 1

## 2023-04-04 MED ORDER — SENNOSIDES-DOCUSATE SODIUM 8.6-50 MG PO TABS
2.0000 | ORAL_TABLET | ORAL | Status: DC
  Administered 2023-04-04 – 2023-04-06 (×3): 2 via ORAL
  Filled 2023-04-04 (×3): qty 2

## 2023-04-04 MED ORDER — DIPHENHYDRAMINE HCL 25 MG PO CAPS: 25.0000 mg | ORAL_CAPSULE | Freq: Four times a day (QID) | ORAL | Status: DC | PRN

## 2023-04-04 MED ORDER — LACTATED RINGERS IV SOLN: INTRAVENOUS | Status: DC

## 2023-04-04 MED ORDER — HYDROMORPHONE HCL 1 MG/ML IJ SOLN: 0.2000 mg | INTRAMUSCULAR | Status: DC | PRN

## 2023-04-04 MED ORDER — PRENATAL MULTIVITAMIN CH
1.0000 | ORAL_TABLET | Freq: Every day | ORAL | Status: DC
  Administered 2023-04-04 – 2023-04-06 (×3): 1 via ORAL
  Filled 2023-04-04 (×3): qty 1

## 2023-04-04 MED ORDER — ACETAMINOPHEN 500 MG PO TABS
1000.0000 mg | ORAL_TABLET | Freq: Four times a day (QID) | ORAL | Status: DC
  Administered 2023-04-04 – 2023-04-06 (×8): 1000 mg via ORAL
  Filled 2023-04-04 (×10): qty 2

## 2023-04-04 MED ORDER — WITCH HAZEL-GLYCERIN EX PADS: 1.0000 | MEDICATED_PAD | CUTANEOUS | Status: DC | PRN

## 2023-04-04 MED ORDER — OXYTOCIN-SODIUM CHLORIDE 30-0.9 UT/500ML-% IV SOLN: 2.5000 [IU]/h | INTRAVENOUS | Status: AC

## 2023-04-04 MED ORDER — OXYCODONE HCL 5 MG PO TABS
5.0000 mg | ORAL_TABLET | ORAL | Status: DC | PRN
  Administered 2023-04-05: 10 mg via ORAL
  Administered 2023-04-05 – 2023-04-06 (×3): 5 mg via ORAL
  Filled 2023-04-04: qty 2
  Filled 2023-04-04 (×3): qty 1

## 2023-04-04 MED ORDER — SIMETHICONE 80 MG PO CHEW
80.0000 mg | CHEWABLE_TABLET | Freq: Three times a day (TID) | ORAL | Status: DC
  Administered 2023-04-04 – 2023-04-06 (×7): 80 mg via ORAL
  Filled 2023-04-04 (×7): qty 1

## 2023-04-04 MED ORDER — MENTHOL 3 MG MT LOZG: 1.0000 | LOZENGE | OROMUCOSAL | Status: DC | PRN

## 2023-04-04 MED ORDER — SIMETHICONE 80 MG PO CHEW: 80.0000 mg | CHEWABLE_TABLET | ORAL | Status: DC | PRN

## 2023-04-04 MED ORDER — KETOROLAC TROMETHAMINE 30 MG/ML IJ SOLN
30.0000 mg | Freq: Four times a day (QID) | INTRAMUSCULAR | Status: DC
  Administered 2023-04-04 (×3): 30 mg via INTRAVENOUS
  Filled 2023-04-04 (×3): qty 1

## 2023-04-04 MED ORDER — COCONUT OIL OIL
1.0000 | TOPICAL_OIL | Status: DC | PRN
  Administered 2023-04-05: 1 via TOPICAL

## 2023-04-04 NOTE — Lactation Note (Signed)
This note was copied from a baby's chart. Lactation Consultation Note  Patient Name: Cynthia Klein [id number] Date: 04/04/2023 Age:29 hours Reason for consult: Follow-up assessment;Primapara;1st time breastfeeding;Infant weight loss;Breastfeeding assistance (2.21% WL)  The infant is at 77 hours old.  LC entered the room and the infant was being held by a support person.  Per the birth parent, the infant has been latching every 1.5-3 hours.  LC spoke with the birth parent about cluster feeding.  She stated that the latch is comfortable and she only feels pain if the infant does not latch correctly.  The birth parent had no questions or concerns at the moment.   Feeding Mother's Current Feeding Choice: Breast Milk and Formula  Interventions Interventions: Education  Consult Status Consult Status: Follow-up Date: 04/05/23 Follow-up type: In-patient   Delene Loll 04/04/2023, 1:55 PM

## 2023-04-04 NOTE — Progress Notes (Signed)
Patient is doing well.  She is tolerating PO, ambulating.  Foley catheter is still in place.  Pain is controlled.  Lochia is appropriate  Vitals:   04/04/23 0330 04/04/23 0339 04/04/23 0515 04/04/23 0750  BP: 138/78 92/70 120/75 100/65  Pulse: 70 (!) 122 80 73  Resp: 18 20 18 16   Temp:   98 F (36.7 C) 97.8 F (36.6 C)  TempSrc:   Oral Oral  SpO2: 99% 99% 99% 97%  Weight:      Height:        NAD Abdomen:  soft, appropriate tenderness, incisions intact and without erythema or drainage ext:    Symmetric, pedal edema bilaterally, +SCDs  Lab Results  Component Value Date   WBC 34.7 (H) 04/04/2023   HGB 9.5 (L) 04/04/2023   HCT 28.1 (L) 04/04/2023   MCV 76.8 (L) 04/04/2023   PLT 201 04/04/2023    --/--/O POS Performed at [facility], [address], [place], [place] [address]  (05/03 2040)  A/P    27 y.o. G1P1001 POD #1 s/p primary cesarean section for arrest of dilation, chorioamnionitis Routine post op and postpartum care.   2.   ABLA: EBL intra op + first fundal rub was .  Preop hgb 8.6, so she received 2 U PRBCs.  Hemoglobin this AM 9.5.  Currently asymptomatic.  Will ambulate today and monitor closely 3. Chorioamnionitis: elevated WBC this AM, received one additional dose of clinda / gent this AM per Dr. Timothy Lasso, then will discontinue.  Clinically well appearing

## 2023-04-04 NOTE — Progress Notes (Signed)
CSW was consulted by floor RN from prior shift; however, consult reason was not listed. CSW inquired about social work needs with current Architect. Floor RN spoke with MOB and inquired if MOB would like to meet with social work, MOB declined consult. No psychosocial or mental health needs or concerns identified in chart review. Consult screened out.  Please contact the Clinical Social Worker if needs arise, by [facility] request, or if MOB scores greater than 9/yes to question 10 on [place] Postpartum Depression Screen.  Signed,  Norberto Sorenson, MSW, LCSWA, LCASA 04/04/2023 10:41 AM

## 2023-04-04 NOTE — Lactation Note (Signed)
This note was copied from a baby's chart. Lactation Consultation Note  Patient Name: Girl Syreta Lasorsa [id number] Date: 04/04/2023 Age:29 hours Reason for consult: Initial assessment;1st time breastfeeding (C/S delivery).  P1, Birth Parent latched infant on her left breast using the football hold position, infant latched with depth, sustaining latch and was still breastfeeding after 15 minutes when LC left the room. Birth Parent will continue to BF infant according to hunger cues, on demand, 8 to 12+ times within 24 hours, STS. Birth Parent knows to call RN/LC for further latch assistance if needed. LC discussed infant's input and output. The importance of maternal rest, diet and hydration. Birth Parent was  made aware of O/P services, breastfeeding support groups, community resources, and our phone # for post-discharge questions.    Maternal Data Has patient been taught Hand Expression?: Yes Does the patient have breastfeeding experience prior to this delivery?: No  Feeding Mother's Current Feeding Choice: Breast Milk and Formula  LATCH Score Latch: Grasps breast easily, tongue down, lips flanged, rhythmical sucking.  Audible Swallowing: Spontaneous and intermittent  Type of Nipple: Everted at rest and after stimulation  Comfort (Breast/Nipple): Soft / non-tender  Hold (Positioning): Assistance needed to correctly position infant at breast and maintain latch.  LATCH Score: 9   Lactation Tools Discussed/Used    Interventions Interventions: Breast feeding basics reviewed;Adjust position;Assisted with latch;Support pillows;Skin to skin;Position options;Education;Breast massage;Expressed milk;LC Services brochure;Breast compression;Reverse pressure  Discharge Pump: DEBP;Personal  Consult Status Consult Status: Follow-up Date: 04/04/23 Follow-up type: In-patient    Frederico Hamman 04/04/2023, 3:07 AM

## 2023-04-05 NOTE — Plan of Care (Signed)
  Problem: Education: Goal: Knowledge of condition will improve Outcome: Completed/Met   Problem: Activity: Goal: Will verbalize the importance of balancing activity with adequate rest periods Outcome: Completed/Met Goal: Ability to tolerate increased activity will improve Outcome: Completed/Met   

## 2023-04-05 NOTE — Progress Notes (Addendum)
The Rn called Dr Timothy Lasso regarding the patient's increased blood pressures due to the  patient pulling out her IV during the blood infusion and rapid response having to stick the  patient many times. The Patient will be receiving two units of blood.   No further orders were given.

## 2023-04-05 NOTE — Progress Notes (Signed)
Patient is doing well.  She is tolerating PO, ambulating, voiding.  Pain is controlled.  Lochia is appropriate  Vitals:   04/04/23 1204 04/04/23 2118 04/05/23 0500 04/05/23 0511  BP: 104/68 104/71 101/75 100/67  Pulse: 98 100 80 76  Resp: 18 16 18 18   Temp: 97.7 F (36.5 C) 98.6 F (37 C) 98 F (36.7 C) 98 F (36.7 C)  TempSrc: Oral Oral Oral Oral  SpO2: 98% 98% 98% 100%  Weight:      Height:        NAD Abdomen:  soft, appropriate tenderness, incisions intact and without erythema or drainage ext:    Symmetric, pedal edema bilaterally, +SCDs  Lab Results  Component Value Date   WBC 34.7 (H) 04/04/2023   HGB 9.5 (L) 04/04/2023   HCT 28.1 (L) 04/04/2023   MCV 76.8 (L) 04/04/2023   PLT 201 04/04/2023    --/--/O POS Performed at [facility], [address], [place], [place] [address]  (05/03 2040)  A/P    27 y.o. G1P1001 POD #1 s/p primary cesarean section for arrest of dilation, chorioamnionitis Routine post op and postpartum care.   2.   ABLA: EBL intra op + first fundal rub was .  Preop hgb 8.6, so she received 2 U PRBCs.  Hemoglobin post-transfusion 9.5.  Currently asymptomatic.    Unsure if she desires discharge today vs tomorrow.  Will call nurse if she desires discharge today

## 2023-04-05 NOTE — Lactation Note (Signed)
This note was copied from a baby's chart. Lactation Consultation Note  Patient Name: Cynthia Klein [id number] Date: 04/05/2023 Age:29 hours Reason for consult: Follow-up assessment;Primapara;1st time breastfeeding;Infant weight loss;Nipple pain/trauma;Term (6 % weight loss) PPH of and transfused  LC discussed with mom the potential effects of PPH on the milk supply and the recommendation for extra post pumping when the baby isn't cluster feeding.  LC Plan : due to sore nipples and PPH .  Prior to latching - steps for latching  Breast massage , hand express, pre- pump and reverse pressure  Work with baby to open her mouth prior to latch.  Offer the 2nd breast if baby still hungry.  After baby settled post pump both breast ( mom prefers to use her handsfree Medela .  LC did not assess latch or assess sore nipples , mom had company in the room.  Both mom and dad receptive to teaching and mentioned they understood the recommendations.    Maternal Data    Feeding Mother's Current Feeding Choice: Breast Milk and Formula  LATCH Score - 7-9     Lactation Tools Discussed/Used Tools: Shells;Pump;Flanges (mom brought her own hand pump and DEBP from home and preferred to use both) Flange Size: 21;24 Breast pump type: Manual;Other (comment) (mom brought her own hand pump and hand free DEBP from home and preferred to use them) Pump Education: Setup, frequency, and cleaning;Other (comment) (LC explained moms hand pump and handsfree DEBP to her Medela)  Interventions  Education   Discharge Pump: Hands Free;Personal;Manual  Consult Status Consult Status: Follow-up Date: 04/06/23 Follow-up type: In-patient    Matilde Sprang Jabari Swoveland 04/05/2023, 2:47 PM

## 2023-04-06 MED ORDER — OXYCODONE HCL 5 MG PO TABS: 5.0000 mg | ORAL_TABLET | Freq: Four times a day (QID) | ORAL | 0 refills | Status: AC | PRN

## 2023-04-06 MED ORDER — OXYCODONE HCL 5 MG PO TABS: 5.0000 mg | ORAL_TABLET | Freq: Four times a day (QID) | ORAL | 0 refills | Status: DC | PRN

## 2023-04-06 MED ORDER — IBUPROFEN 800 MG PO TABS: 800.0000 mg | ORAL_TABLET | Freq: Three times a day (TID) | ORAL | 1 refills | Status: AC | PRN

## 2023-04-06 NOTE — Discharge Summary (Addendum)
Postpartum Discharge Summary  Date of Service updated     Patient Name: Cynthia Klein DOB: 1996-06-10 MRN: [id number]  Date of admission: 04/02/2023 Delivery date:04/03/2023  Delivering provider: Derl Barrow E  Date of discharge: 04/06/2023  Admitting diagnosis: Pregnant [Z34.90] Intrauterine pregnancy: [redacted]w[redacted]d     Secondary diagnosis:  Principal Problem:   Pregnant  Additional problems: s/p Section, arrest of dilation, chorioamnionitis    Discharge diagnosis: Term Pregnancy Delivered                                              Post partum procedures:blood transfusion Augmentation: Pitocin and Cytotec Complications: Intrauterine Inflammation or infection (Chorioamniotis) and Hemorrhage>1077mL  Hospital course: Induction of Labor With Cesarean Section   27 y.o. yo G1P1001 at [redacted]w[redacted]d was admitted to the hospital 04/02/2023 for induction of labor. Patient had a labor course significant for chorioamnionitis. The patient went for cesarean section due to Arrest of Dilation. Delivery details are as follows: Membrane Rupture Time/Date: 12:00 AM ,04/03/2023   Delivery Method:C-Section, Low Transverse  Details of operation can be found in separate operative Note.  Patient had a postpartum course complicated by anemia 2/2 acute EBL. She is ambulating, tolerating a regular diet, passing flatus, and urinating well.  Patient is discharged home in stable condition on 04/06/23.      Newborn Data: Birth date:04/03/2023  Birth time:7:42 PM  Gender:Female  Living status:Living  Apgars:8 ,9  Weight:3620 g                                Transfusion:Yes  Physical exam  Vitals:   04/05/23 0511 04/05/23 1540 04/05/23 2030 04/06/23 0533  BP: 100/67 117/78 118/84 108/78  Pulse: 76 98 100 100  Resp: 18 18 16 18   Temp: 98 F (36.7 C) 97.9 F (36.6 C) 98.4 F (36.9 C)   TempSrc: Oral Oral Oral   SpO2: 100%  99% 100%  Weight:      Height:       General: alert, cooperative, and no  distress Lochia: appropriate Uterine Fundus: firm Incision: Healing well with no significant drainage, No significant erythema, Dressing is clean, dry, and intact DVT Evaluation: No evidence of DVT seen on physical exam. Labs: Lab Results  Component Value Date   WBC 34.7 (H) 04/04/2023   HGB 9.5 (L) 04/04/2023   HCT 28.1 (L) 04/04/2023   MCV 76.8 (L) 04/04/2023   PLT 201 04/04/2023      Latest Ref Rng & Units 10/04/2019    2:04 PM  CMP  Glucose 65 - 99 mg/dL 84   BUN 6 - 20 mg/dL 8   Creatinine 4.09 - 8.11 mg/dL 9.14   Sodium 782 - 956 mmol/L 140   Potassium 3.5 - 5.2 mmol/L 4.5   Chloride 96 - 106 mmol/L 104   CO2 20 - 29 mmol/L 23   Calcium 8.7 - 10.2 mg/dL 9.8   Total Protein 6.0 - 8.5 g/dL 6.6   Total Bilirubin 0.0 - 1.2 mg/dL <2.1   Alkaline Phos 39 - 117 IU/L 74   AST 0 - 40 IU/L 16   ALT 0 - 32 IU/L 12    Edinburgh Score:    04/04/2023    1:30 PM  Edinburgh Postnatal Depression Scale Screening Tool  I have  been able to laugh and see the funny side of things. 0  I have looked forward with enjoyment to things. 0  I have blamed myself unnecessarily when things went wrong. 2  I have been anxious or worried for no good reason. 0  I have felt scared or panicky for no good reason. 0  Things have been getting on top of me. 0  I have been so unhappy that I have had difficulty sleeping. 0  I have felt sad or miserable. 0  I have been so unhappy that I have been crying. 1  The thought of harming myself has occurred to me. 0  Edinburgh Postnatal Depression Scale Total 3      After visit meds:  Allergies as of 04/06/2023       Reactions   Penicillins Hives        Medication List     STOP taking these medications    metoCLOPramide 10 MG tablet Commonly known as: REGLAN   ondansetron 4 MG disintegrating tablet Commonly known as: ZOFRAN-ODT       TAKE these medications    acetaminophen 500 MG tablet Commonly known as: TYLENOL Take 500 mg by mouth every  6 (six) hours as needed.   ibuprofen 800 MG tablet Commonly known as: ADVIL Take 1 tablet (800 mg total) by mouth every 8 (eight) hours as needed.   oxyCODONE 5 MG immediate release tablet Commonly known as: Oxy IR/ROXICODONE Take 1 tablet (5 mg total) by mouth every 6 (six) hours as needed for moderate pain or severe pain.               Discharge Care Instructions  (From admission, onward)           Start     Ordered   04/06/23 0000  Leave dressing on - Keep it clean, dry, and intact until clinic visit        04/06/23 0953             Discharge home in stable condition Infant Feeding: Breast Infant Disposition:home with mother Discharge instruction: per After Visit Summary and Postpartum booklet. Activity: Advance as tolerated. Pelvic rest for 6 weeks.  Diet: routine diet Anticipated Birth Control: Unsure Postpartum Appointment:6 weeks Additional Postpartum F/U: Incision check 2 wks Follow up Visit:[facility]   04/06/2023 Carlisle Cater, MD

## 2023-04-06 NOTE — Lactation Note (Signed)
This note was copied from a baby's chart. Lactation Consultation Note  Patient Name: Cynthia Klein FAOZH'Y Date: 04/06/2023 Age:29 hours  Reason for consult: Follow-up assessment;Nipple pain/trauma;Term;Other (Comment);Infant weight loss (mother had a PPH)  P1, GA [redacted]w[redacted]d, C/S, PPH, 9% weight loss  Upon entry to room, Baby is latching well with strong suckles but pulling off breast, fretful. Mother has been feeding every 2 hours through the night. She has colostrum.  Observed mother and infant latched well with audible swallows heard in burst. Mother states her nipples are getting sore due to "frequency of feedings". Mother is very motivated to breastfeed only but is acknowledging her baby is fussy and "acts like she wants more" . Mother wants to supplement, options reviewed for supplementation and syringe/ tube feeding was selected. Tube attached to syringe was placed inside infant's mouth after mother had baby latched well. Tube was anchored to breast once it was observed placement was correct and infant was easily pulling milk from the supplemental tool. Mother was very pleased with that option opposed to bottle nipple. Infant has ped appt/ weight check tomorrow with Ped. Advised if any concerns or uncomfortable with this method to supplement using a bottle nipple. Encouraged to supplement every 2-3 hrs. Infant tolerated feeding well while latched to breast. She took 15 ml and feel asleep. Reviewed cleaning, insertion in mouth, anchoring tube and watching baby for feeding tolerance.   Mom made aware of O/P services, breastfeeding support groups, community resources, and our phone # for post-discharge questions.    Maternal Data Has patient been taught Hand Expression?: Yes Does the patient have breastfeeding experience prior to this delivery?: No  Feeding Mother's Current Feeding Choice: Breast Milk and Formula  LATCH Score Latch: Grasps breast easily, tongue down, lips flanged, rhythmical  sucking.  Audible Swallowing: Spontaneous and intermittent  Type of Nipple: Everted at rest and after stimulation  Comfort (Breast/Nipple): Filling, red/small blisters or bruises, mild/mod discomfort  Hold (Positioning): No assistance needed to correctly position infant at breast.  LATCH Score: 9   Lactation Tools Discussed/Used Tools: 35F feeding tube / Syringe;Shells;Coconut oil  Interventions Interventions: Assisted with latch;Hand express;Adjust position;Support pillows;Education  Discharge Discharge Education: Engorgement and breast care;Warning signs for feeding baby;Other (comment) (discussed OP Lactation Sercives available throught [facility]) Pump: Personal;Hands Free;Manual  Consult Status Consult Status: Complete Date: 04/06/23    Omar Person 04/06/2023, 11:02 AM

## 2023-04-06 NOTE — Progress Notes (Signed)
Patient is doing well.  She is tolerating PO, ambulating, voiding.  Pain is controlled.  Lochia is appropriate  Vitals:   04/05/23 0511 04/05/23 1540 04/05/23 2030 04/06/23 0533  BP: 100/67 117/78 118/84 108/78  Pulse: 76 98 100 100  Resp: 18 18 16 18   Temp: 98 F (36.7 C) 97.9 F (36.6 C) 98.4 F (36.9 C)   TempSrc: Oral Oral Oral   SpO2: 100%  99% 100%  Weight:      Height:        NAD Abdomen:  soft, appropriate tenderness, incisions intact and without erythema or drainage ext:    Symmetric, trace pedal edema bilaterally, +SCDs  Lab Results  Component Value Date   WBC 34.7 (H) 04/04/2023   HGB 9.5 (L) 04/04/2023   HCT 28.1 (L) 04/04/2023   MCV 76.8 (L) 04/04/2023   PLT 201 04/04/2023    --/--/O POS Performed at [facility], [address] [place], [place] [address]  (05/03 2040)  A/P    27 y.o. G1P1001 POD #2 s/p primary cesarean section for arrest of dilation, chorioamnionitis Routine post op and postpartum care.   2.   ABLA: EBL intra op + first fundal rub was .  Preop hgb 8.6, s/p 2 U PRBCs.  Hemoglobin post-transfusion 9.5.  Patient continues to feel improved    Desires discharge home today pending baby exam

## 2023-04-07 ENCOUNTER — Encounter (HOSPITAL_COMMUNITY): Payer: Self-pay | Admitting: Obstetrics and Gynecology

## 2023-04-07 LAB — SURGICAL PATHOLOGY

## 2023-04-09 ENCOUNTER — Inpatient Hospital Stay (HOSPITAL_COMMUNITY): Payer: BC Managed Care – PPO

## 2023-04-12 ENCOUNTER — Inpatient Hospital Stay (HOSPITAL_COMMUNITY)
Admission: AD | Admit: 2023-04-12 | Discharge: 2023-04-12 | Disposition: A | Discharge status: Home / Self Care | Payer: BC Managed Care – PPO | Attending: Obstetrics and Gynecology | Admitting: Obstetrics and Gynecology

## 2023-04-12 ENCOUNTER — Encounter (HOSPITAL_COMMUNITY): Payer: Self-pay | Admitting: Obstetrics and Gynecology

## 2023-04-12 DIAGNOSIS — R110 Nausea: Secondary | ICD-10-CM

## 2023-04-12 DIAGNOSIS — O99893 Other specified diseases and conditions complicating puerperium: Secondary | ICD-10-CM | POA: Diagnosis not present

## 2023-04-12 DIAGNOSIS — Z880 Allergy status to penicillin: Secondary | ICD-10-CM | POA: Insufficient documentation

## 2023-04-12 LAB — CBC WITH DIFFERENTIAL/PLATELET
Abs Immature Granulocytes: 1.36 10*3/uL — ABNORMAL HIGH (ref 0.00–0.07)
Basophils Absolute: 0.3 10*3/uL — ABNORMAL HIGH (ref 0.0–0.1)
Basophils Relative: 1 %
Eosinophils Absolute: 0.2 10*3/uL (ref 0.0–0.5)
Eosinophils Relative: 1 %
HCT: 34.9 % — ABNORMAL LOW (ref 36.0–46.0)
Hemoglobin: 10.8 g/dL — ABNORMAL LOW (ref 12.0–15.0)
Immature Granulocytes: 6 %
Lymphocytes Relative: 12 %
Lymphs Abs: 2.9 10*3/uL (ref 0.7–4.0)
MCH: 24.3 pg — ABNORMAL LOW (ref 26.0–34.0)
MCHC: 30.9 g/dL (ref 30.0–36.0)
MCV: 78.6 fL — ABNORMAL LOW (ref 80.0–100.0)
Monocytes Absolute: 0.8 10*3/uL (ref 0.1–1.0)
Monocytes Relative: 3 %
Neutro Abs: 18.6 10*3/uL — ABNORMAL HIGH (ref 1.7–7.7)
Neutrophils Relative %: 77 %
Platelets: 797 10*3/uL — ABNORMAL HIGH (ref 150–400)
RBC: 4.44 MIL/uL (ref 3.87–5.11)
RDW: 19.7 % — ABNORMAL HIGH (ref 11.5–15.5)
WBC: 24.1 10*3/uL — ABNORMAL HIGH (ref 4.0–10.5)
nRBC: 0 % (ref 0.0–0.2)

## 2023-04-12 LAB — URINALYSIS, ROUTINE W REFLEX MICROSCOPIC
Bilirubin Urine: NEGATIVE
Glucose, UA: NEGATIVE mg/dL
Ketones, ur: 20 mg/dL — AB
Nitrite: NEGATIVE
Protein, ur: 100 mg/dL — AB
RBC / HPF: >50 RBC/hpf (ref 0–5)
Specific Gravity, Urine: 1.024 (ref 1.005–1.030)
WBC, UA: >50 WBC/hpf (ref 0–5)
pH: 5 (ref 5.0–8.0)

## 2023-04-12 LAB — COMPREHENSIVE METABOLIC PANEL
ALT: 23 U/L (ref 0–44)
AST: 16 U/L (ref 15–41)
Albumin: 3 g/dL — ABNORMAL LOW (ref 3.5–5.0)
Alkaline Phosphatase: 266 U/L — ABNORMAL HIGH (ref 38–126)
Anion gap: 13 (ref 5–15)
BUN: 15 mg/dL (ref 6–20)
CO2: 22 mmol/L (ref 22–32)
Calcium: 9.6 mg/dL (ref 8.9–10.3)
Chloride: 101 mmol/L (ref 98–111)
Creatinine, Ser: 1.06 mg/dL — ABNORMAL HIGH (ref 0.44–1.00)
GFR, Estimated: >60 mL/min (ref >60)
Glucose, Bld: 89 mg/dL (ref 70–99)
Potassium: 4.7 mmol/L (ref 3.5–5.1)
Sodium: 136 mmol/L (ref 135–145)
Total Bilirubin: 0.5 mg/dL (ref 0.3–1.2)
Total Protein: 7.4 g/dL (ref 6.5–8.1)

## 2023-04-12 LAB — LIPASE, BLOOD: Lipase: 24 U/L (ref 11–51)

## 2023-04-12 LAB — AMYLASE: Amylase: 42 U/L (ref 28–100)

## 2023-04-12 MED ORDER — ONDANSETRON 4 MG PO TBDP
8.0000 mg | ORAL_TABLET | Freq: Once | ORAL | Status: AC
  Administered 2023-04-12: 8 mg via ORAL
  Filled 2023-04-12: qty 2

## 2023-04-12 MED ORDER — ONDANSETRON 8 MG PO TBDP: 8.0000 mg | ORAL_TABLET | Freq: Three times a day (TID) | ORAL | 0 refills | Status: AC | PRN

## 2023-04-12 NOTE — MAU Provider Note (Signed)
History     CSN: [id number]  Arrival date and time: 04/12/23 1956   Event Date/Time   First Provider Initiated Contact with Patient 04/12/23 2040      Chief Complaint  Patient presents with   Nausea   Headache   Fever   HPI  Cynthia Klein is a 27 y.o. G1P1001 postpartum from a primary c/s on 5/3 who presents for evaluation of nausea. Patient reports for the last 2 days she has not had an appetite and feels nauseous every time she tries to force herself to eat. She has not vomited. She reports some soreness in her back and abdomen. Patient rates the pain as a 3/10 and has not tried anything for the pain. She reports a temperature of 99 at home before coming to MAU. She denies any vaginal bleeding and discharge. Denies any constipation, diarrhea or any urinary complaints. She feels like her incision is healing well.    OB History     Gravida  1   Para  1   Term  1   Preterm      AB      Living  1      SAB      IAB      Ectopic      Multiple  0   Live Births  1           Past Medical History:  Diagnosis Date   Asthma    Chicken pox    ?   Frequent headaches     Past Surgical History:  Procedure Laterality Date   CESAREAN SECTION N/A 04/03/2023   Procedure: CESAREAN SECTION;  Surgeon: Charlett Nose, MD;  Location: [facility] LD ORS;  Service: Obstetrics;  Laterality: N/A;   WISDOM TOOTH EXTRACTION      Family History  Problem Relation Age of Onset   Depression Mother    Bipolar disorder Mother    Hypertension Father    Depression Sister    Bipolar disorder Sister    Diabetes Sister        type 1   Depression Sister    Thyroid disease Sister    Diabetes Maternal Uncle    Cancer Paternal Uncle        skin   Diabetes Maternal Grandfather    Dementia Paternal Grandmother    Stroke Neg Hx    Heart disease Neg Hx     Social History   Tobacco Use   Smoking status: Never   Smokeless tobacco: Never  Vaping Use   Vaping Use: Never used   Substance Use Topics   Alcohol use: Not Currently    Comment: occasional   Drug use: No    Allergies:  Allergies  Allergen Reactions   Penicillins Hives    No medications prior to admission.    Review of Systems  Constitutional: Negative.  Negative for fatigue and fever.  HENT: Negative.    Respiratory: Negative.  Negative for shortness of breath.   Cardiovascular: Negative.  Negative for chest pain.  Gastrointestinal:  Positive for nausea. Negative for abdominal pain, constipation, diarrhea and vomiting.  Genitourinary: Negative.  Negative for dysuria, vaginal bleeding and vaginal discharge.  Neurological: Negative.  Negative for dizziness and headaches.   Physical Exam   Blood pressure 121/70, pulse 92, temperature 98.9 F (37.2 C), resp. rate 18, height 4\' 11"  (1.499 m), weight 55.1 kg, last menstrual period 06/23/2022, SpO2 100 %, currently breastfeeding.  Patient Vitals for the  past 24 hrs:  BP Temp Temp src Pulse Resp SpO2 Height Weight  04/12/23 2216 121/70 98.9 F (37.2 C) -- 92 18 100 % -- --  04/12/23 2135 116/81 -- -- 94 -- 99 % -- --  04/12/23 2031 116/74 98.8 F (37.1 C) Oral (!) 115 18 96 % 4\' 11"  (1.499 m) 55.1 kg  04/12/23 2030 -- -- -- -- -- 97 % -- --    Physical Exam Vitals and nursing note reviewed.  Constitutional:      General: She is not in acute distress.    Appearance: She is well-developed.  HENT:     Head: Normocephalic.  Eyes:     Pupils: Pupils are equal, round, and reactive to light.  Cardiovascular:     Rate and Rhythm: Normal rate and regular rhythm.     Heart sounds: Normal heart sounds.  Pulmonary:     Effort: Pulmonary effort is normal. No respiratory distress.     Breath sounds: Normal breath sounds.  Abdominal:     General: Bowel sounds are normal. There is no distension.     Palpations: Abdomen is soft.     Tenderness: There is no abdominal tenderness.  Skin:    General: Skin is warm and dry.  Neurological:      Mental Status: She is alert and oriented to person, place, and time.  Psychiatric:        Mood and Affect: Mood normal.        Behavior: Behavior normal.        Thought Content: Thought content normal.        Judgment: Judgment normal.    MAU Course  Procedures  Results for orders placed or performed during the hospital encounter of 04/12/23 (from the past 24 hour(s))  Urinalysis, Routine w reflex microscopic -Urine, Clean Catch     Status: Abnormal   Collection Time: 04/12/23  8:19 PM  Result Value Ref Range   Color, Urine AMBER (A) YELLOW   APPearance CLOUDY (A) CLEAR   Specific Gravity, Urine 1.024 1.005 - 1.030   pH 5.0 5.0 - 8.0   Glucose, UA NEGATIVE NEGATIVE mg/dL   Hgb urine dipstick LARGE (A) NEGATIVE   Bilirubin Urine NEGATIVE NEGATIVE   Ketones, ur 20 (A) NEGATIVE mg/dL   Protein, ur 161 (A) NEGATIVE mg/dL   Nitrite NEGATIVE NEGATIVE   Leukocytes,Ua LARGE (A) NEGATIVE   RBC / HPF >50 0 - 5 RBC/hpf   WBC, UA >50 0 - 5 WBC/hpf   Bacteria, UA FEW (A) NONE SEEN   Squamous Epithelial / HPF 6-10 0 - 5 /HPF   Mucus PRESENT   CBC with Differential/Platelet     Status: Abnormal   Collection Time: 04/12/23  8:56 PM  Result Value Ref Range   WBC 24.1 (H) 4.0 - 10.5 K/uL   RBC 4.44 3.87 - 5.11 MIL/uL   Hemoglobin 10.8 (L) 12.0 - 15.0 g/dL   HCT 09.6 (L) 04.5 - 40.9 %   MCV 78.6 (L) 80.0 - 100.0 fL   MCH 24.3 (L) 26.0 - 34.0 pg   MCHC 30.9 30.0 - 36.0 g/dL   RDW 81.1 (H) 91.4 - 78.2 %   Platelets 797 (H) 150 - 400 K/uL   nRBC 0.0 0.0 - 0.2 %   Neutrophils Relative % 77 %   Neutro Abs 18.6 (H) 1.7 - 7.7 K/uL   Lymphocytes Relative 12 %   Lymphs Abs 2.9 0.7 - 4.0 K/uL   Monocytes Relative  3 %   Monocytes Absolute 0.8 0.1 - 1.0 K/uL   Eosinophils Relative 1 %   Eosinophils Absolute 0.2 0.0 - 0.5 K/uL   Basophils Relative 1 %   Basophils Absolute 0.3 (H) 0.0 - 0.1 K/uL   Immature Granulocytes 6 %   Abs Immature Granulocytes 1.36 (H) 0.00 - 0.07 K/uL  Comprehensive  metabolic panel     Status: Abnormal   Collection Time: 04/12/23  8:56 PM  Result Value Ref Range   Sodium 136 135 - 145 mmol/L   Potassium 4.7 3.5 - 5.1 mmol/L   Chloride 101 98 - 111 mmol/L   CO2 22 22 - 32 mmol/L   Glucose, Bld 89 70 - 99 mg/dL   BUN 15 6 - 20 mg/dL   Creatinine, Ser 1.61 (H) 0.44 - 1.00 mg/dL   Calcium 9.6 8.9 - 09.6 mg/dL   Total Protein 7.4 6.5 - 8.1 g/dL   Albumin 3.0 (L) 3.5 - 5.0 g/dL   AST 16 15 - 41 U/L   ALT 23 0 - 44 U/L   Alkaline Phosphatase 266 (H) 38 - 126 U/L   Total Bilirubin 0.5 0.3 - 1.2 mg/dL   GFR, Estimated >04 >54 mL/min   Anion gap 13 5 - 15  Amylase     Status: None   Collection Time: 04/12/23  8:56 PM  Result Value Ref Range   Amylase 42 28 - 100 U/L  Lipase, blood     Status: None   Collection Time: 04/12/23  8:56 PM  Result Value Ref Range   Lipase 24 11 - 51 U/L    MDM Labs ordered and reviewed.   UA, UC CBC with Diff, CMP Amylase, Lipase Zofran ODT  Able to tolerate PO and reports improvement of nausea.   CNM consulted with Dr. Berton Lan regarding presentation and results- MD recommends discharge home due to improving labs and symptoms  Assessment and Plan   1. Nausea   2. Postpartum state     -Discharge home in stable condition -Rx for zofran sent to pharmacy -Postpartum precautions discussed -Patient advised to follow-up with OB as scheduled for postpartum care -Patient may return to MAU as needed or if her condition were to change or worsen  Rolm Bookbinder, CNM 04/13/2023, 8:40 PM

## 2023-04-12 NOTE — MAU Note (Signed)
.  Cynthia Klein is a 27 y.o. at [facility] C/S 9 days here in MAU reporting: low grade fever of 99.8 today. Pt also reports chills, HA, and nausea no episodes of emesis for a last couple days. Pt took Motrin 800mg  around 1800, prior to the HA. Pt denies dysuria or concerns with incision. Denies exposure to illness. Pt is breastfeeding.    Onset of complaint: ongoing for last 2 days  Pain score: 3/10 Vitals:   04/12/23 2030 04/12/23 2031  BP:  116/74  Pulse:  (!) 115  Resp:  18  Temp:  98.8 F (37.1 C)  SpO2: 97% 96%      Lab orders placed from triage:  UA

## 2023-04-13 ENCOUNTER — Encounter: Payer: Self-pay | Admitting: Obstetrics and Gynecology

## 2023-04-14 ENCOUNTER — Telehealth (HOSPITAL_COMMUNITY): Payer: Self-pay

## 2023-04-14 LAB — CULTURE, OB URINE: Culture: NO GROWTH

## 2023-04-14 NOTE — Telephone Encounter (Signed)
Patient reports feeling good and that her incision is doing well. Patient declines questions/concerns about her health and healing.  Patient reports that baby is doing well. Eating, peeing/pooping, and gaining weight well. Baby sleeps in a bassinet. RN reviewed ABC's of safe sleep with patient. Patient declines any questions or concerns about baby.  EPDS score is 4.  [facility]  Perinatal Services   04/14/23,1327

## 2024-04-14 ENCOUNTER — Ambulatory Visit: Payer: Self-pay | Admitting: Medical

## 2024-04-14 NOTE — Telephone Encounter (Signed)
 Chief Complaint: Dizziness x3 weeks  Symptoms: Nausea, slight headaches Frequency: Intermittent  Disposition:  [x] Appointment (In office)  Additional Notes: Patient scheduled for appointment on Monday, 5/19. This RN educated pt on new-worsening symptoms and when to call back/seek emergent care. Pt verbalized understanding and agrees to plan. \  Copied from CRM [id number]. Topic: Clinical - Red Word Triage >> Apr 14, 2024  1:18 PM Antwanette L wrote: Red Word that prompted transfer to Nurse Triage: Patient is experiencing dizziness and nausea Reason for Disposition  [1] MILD dizziness (e.g., vertigo; walking normally) AND [2] has NOT been evaluated by doctor (or NP/PA) for this  Answer Assessment - Initial Assessment Questions DESCRIPTION: "Describe your dizziness."     Room is spinning SEVERITY: "How bad is it?"  "Can you walk?"   - MILD: Feels slightly dizzy and unsteady, but is walking normally.   - MODERATE: Feels unsteady when walking, but not falling; interferes with normal activities (e.g., school, work).   - SEVERE: Unable to walk without falling, or requires assistance to walk without falling.     Mild ONSET:  "When did the dizziness begin?"     3 weeks ago AGGRAVATING FACTORS: "Does anything make it worse?" (e.g., standing, change in head position)     No CAUSE: "What do you think is causing the dizziness?"     Not sure RECURRENT SYMPTOM: "Have you had dizziness before?" If Yes, ask: "When was the last time?" "What happened that time?"     No OTHER SYMPTOMS: "Do you have any other symptoms?" (e.g., headache, weakness, numbness, vomiting, earache)     Headache  Protocols used: Dizziness - Vertigo-A-AH

## 2024-04-18 ENCOUNTER — Ambulatory Visit: Admitting: Family Medicine

## 2024-04-27 ENCOUNTER — Ambulatory Visit: Admitting: Family Medicine
